# Patient Record
Sex: Female | Born: 2000 | Race: Black or African American | Hispanic: No | Marital: Single | Smoking: Former smoker
Health system: Southern US, Community
[De-identification: ages and names within clinical notes are randomized; demographics above are authoritative.]

## PROBLEM LIST (undated history)

## (undated) DIAGNOSIS — R011 Cardiac murmur, unspecified: Secondary | ICD-10-CM

---
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
## 2020-05-03 ENCOUNTER — Encounter (INDEPENDENT_AMBULATORY_CARE_PROVIDER_SITE_OTHER): Payer: Self-pay | Admitting: Primary Care

## 2020-05-03 ENCOUNTER — Ambulatory Visit (INDEPENDENT_AMBULATORY_CARE_PROVIDER_SITE_OTHER): Payer: Self-pay | Admitting: Primary Care

## 2020-05-03 ENCOUNTER — Other Ambulatory Visit: Payer: Self-pay

## 2020-05-03 VITALS — BP 120/72 | HR 62 | Temp 98.2°F | Ht 70.0 in | Wt 245.8 lb

## 2020-05-03 DIAGNOSIS — Z3009 Encounter for other general counseling and advice on contraception: Secondary | ICD-10-CM

## 2020-05-03 DIAGNOSIS — Z6835 Body mass index (BMI) 35.0-35.9, adult: Secondary | ICD-10-CM

## 2020-05-03 DIAGNOSIS — Z7689 Persons encountering health services in other specified circumstances: Secondary | ICD-10-CM

## 2020-05-03 DIAGNOSIS — E6609 Other obesity due to excess calories: Secondary | ICD-10-CM

## 2020-05-03 DIAGNOSIS — Z1159 Encounter for screening for other viral diseases: Secondary | ICD-10-CM

## 2020-05-03 DIAGNOSIS — E66812 Obesity, class 2: Secondary | ICD-10-CM

## 2020-05-03 NOTE — Progress Notes (Signed)
New Patient Office Visit  Subjective:  Patient ID: Mary Zavala, female    DOB: 19-Jul-2001  Age: 19 y.o. MRN: [id number]  CC:  Chief Complaint  Patient presents with  . New Patient (Initial Visit)    HPI Ms. Sarissa A Kaelin is a 19 year old female who presents for establishment of care. She is sexually active .  History reviewed. No pertinent past medical history.  History reviewed. No pertinent surgical history.  Family History  Problem Relation Age of Onset  . Hypertension Mother   . Asthma Mother   . Asthma Sister   . Asthma Brother   . Diabetes Maternal Grandmother   . Arthritis Maternal Grandfather   . Diabetes Paternal Grandmother   . Hypertension Paternal Grandmother   . Kidney disease Paternal Grandmother   . Arthritis Paternal Grandmother     Social History   Socioeconomic History  . Marital status: Single    Spouse name: Not on file  . Number of children: Not on file  . Years of education: Not on file  . Highest education level: Not on file  Occupational History  . Not on file  Tobacco Use  . Smoking status: Never Smoker  . Smokeless tobacco: Never Used  Substance and Sexual Activity  . Alcohol use: Not on file  . Drug use: Not on file  . Sexual activity: Not on file  Other Topics Concern  . Not on file  Social History Narrative  . Not on file   Social Determinants of Health   Financial Resource Strain:   . Difficulty of Paying Living Expenses:   Food Insecurity:   . Worried About Charity fundraiser in the Last Year:   . Arboriculturist in the Last Year:   Transportation Needs:   . Film/video editor (Medical):   Marland Kitchen Lack of Transportation (Non-Medical):   Physical Activity:   . Days of Exercise per Week:   . Minutes of Exercise per Session:   Stress:   . Feeling of Stress :   Social Connections:   . Frequency of Communication with Friends and Family:   . Frequency of Social Gatherings with Friends and Family:   . Attends  Religious Services:   . Active Member of Clubs or Organizations:   . Attends Archivist Meetings:   Marland Kitchen Marital Status:   Intimate Partner Violence:   . Fear of Current or Ex-Partner:   . Emotionally Abused:   Marland Kitchen Physically Abused:   . Sexually Abused:     ROS Review of Systems  All other systems reviewed and are negative.   Objective:   Today's Vitals: Vitals:   05/03/20 0909  BP: 120/72  Pulse: 62  Temp: 98.2 F (36.8 C)  TempSrc: Oral  SpO2: 99%  Weight: 245 lb 12.8 oz (111.5 kg)  Height: 5' 10"  (1.778 m)     Physical Exam  General: Vital signs reviewed.  Patient is well-developed and well-nourished,obese female  in no acute distress and cooperative with exam.  Head: Normocephalic and atraumatic. Eyes: EOMI, conjunctivae normal, no scleral icterus.  Neck: Supple, trachea midline, normal ROM, no JVD, masses, thyromegaly, or carotid bruit present.  Cardiovascular: RRR, S1 normal, S2 normal, no murmurs, gallops, or rubs. Pulmonary/Chest: Clear to auscultation bilaterally, no wheezes, rales, or rhonchi. Abdominal: Soft, non-tender, non-distended, BS +, no masses, organomegaly, or guarding present.  Musculoskeletal: No joint deformities, erythema, or stiffness, ROM full and nontender. Extremities: No lower extremity  edema bilaterally,  pulses symmetric and intact bilaterally. No cyanosis or clubbing. Neurological: A&O x3, Strength is normal and symmetric bilaterally, cranial nerve II-XII are grossly intact, no focal motor deficit, sensory intact to light touch bilaterally.  Skin: Warm, dry and intact. No rashes or erythema. Psychiatric: Normal mood and affect. speech and behavior is normal. Cognition and memory are normal.  Assessment & Plan:   Saraann was seen today for new patient (initial visit).  Diagnoses and all orders for this visit:  Encounter to establish care Juluis Mire, NP-C will be your  (PCP) she is mastered prepared . Able to diagnosed and  treatment also  answer health concern as well as continuing care of varied medical conditions, not limited by cause, organ system, or diagnosis.  -     CBC with Differential -     CMP14+EGFR  Encounter for other general counseling or advice on contraception Provided information on options . Return appointment will prescribe medication    Class 2 obesity due to excess calories without serious comorbidity with body mass index (BMI) of 35.0 to 35.9 in adult Obesity is 30-39 indicating an excess in caloric intake or underlining conditions. This may lead to other co-morbidities.HTN, DM respiratory problems.  Lifestyle modifications of diet and exercise may reduce obesity.  -     CBC with Differential -     CMP14+EGFR  Encounter for hepatitis C screening test for low risk patient Health care mantienace and care gaps -     Hepatitis C Antibody    Outpatient Encounter Medications as of 05/03/2020  Medication Sig  . [DISCONTINUED] cetirizine (ZYRTEC) 10 MG tablet Take 1 tablet (10 mg total) by mouth daily.  . [DISCONTINUED] ibuprofen (CHILDRENS IBUPROFEN) 100 MG/5ML suspension Take 30 mLs (600 mg total) by mouth every 6 (six) hours as needed.  . [DISCONTINUED] Multiple Vitamins-Minerals (MULTIVITAMIN GUMMIES ADULT PO) Take by mouth.  . [DISCONTINUED] Vitamin D, Ergocalciferol, (DRISDOL) 50000 units CAPS capsule Take 1 capsule (50,000 Units total) by mouth every 7 (seven) days.   No facility-administered encounter medications on file as of 05/03/2020.    Follow-up: Return in about 2 weeks (around 05/17/2020), or birth control.   Kerin Perna, NP

## 2020-05-03 NOTE — Patient Instructions (Signed)
.m Contraception Choices Contraception, also called birth control, means things to use or ways to try not to get pregnant. Hormonal birth control This kind of birth control uses hormones. Here are some types of hormonal birth control:  A tube that is put under skin of the arm (implant). The tube can stay in for as long as 3 years.  Shots to get every 3 months (injections).  Pills to take every day (birth control pills).  A patch to change 1 time each week for 3 weeks (birth control patch). After that, the patch is taken off for 1 week.  A ring to put in the vagina. The ring is left in for 3 weeks. Then it is taken out of the vagina for 1 week. Then a new ring is put in.  Pills to take after unprotected sex (emergency birth control pills). Barrier birth control Here are some types of barrier birth control:  A thin covering that is put on the penis before sex (female condom). The covering is thrown away after sex.  A soft, loose covering that is put in the vagina before sex (female condom). The covering is thrown away after sex.  A rubber bowl that sits over the cervix (diaphragm). The bowl must be made for you. The bowl is put into the vagina before sex. The bowl is left in for 6-8 hours after sex. It is taken out within 24 hours.  A small, soft cup that fits over the cervix (cervical cap). The cup must be made for you. The cup can be left in for 6-8 hours after sex. It is taken out within 48 hours.  A sponge that is put into the vagina before sex. It must be left in for at least 6 hours after sex. It must be taken out within 30 hours. Then it is thrown away.  A chemical that kills or stops sperm from getting into the uterus (spermicide). It may be a pill, cream, jelly, or foam to put in the vagina. The chemical should be used at least 10-15 minutes before sex. IUD (intrauterine) birth control An IUD is a small, T-shaped piece of plastic. It is put inside the uterus. There are two  kinds:  Hormone IUD. This kind can stay in for 3-5 years.  Copper IUD. This kind can stay in for 10 years. Permanent birth control Here are some types of permanent birth control:  Surgery to block the fallopian tubes.  Having an insert put into each fallopian tube.  Surgery to tie off the tubes that carry sperm (vasectomy). Natural planning birth control Here are some types of natural planning birth control:  Not having sex on the days the woman could get pregnant.  Using a calendar: ? To keep track of the length of each period. ? To find out what days pregnancy can happen. ? To plan to not have sex on days when pregnancy can happen.  Watching for symptoms of ovulation and not having sex during ovulation. One way the woman can check for ovulation is to check her temperature.  Waiting to have sex until after ovulation. Summary  Contraception, also called birth control, means things to use or ways to try not to get pregnant.  Hormonal methods of birth control include implants, injections, pills, patches, vaginal rings, and emergency birth control pills.  Barrier methods of birth control can include female condoms, female condoms, diaphragms, cervical caps, sponges, and spermicides.  There are two types of IUD (intrauterine device) birth  control. An IUD can be put in a woman's uterus to prevent pregnancy for 3-5 years.  Permanent sterilization can be done through a procedure for males, females, or both.  Natural planning methods involve not having sex on the days when the woman could get pregnant. This information is not intended to replace advice given to you by your health care provider. Make sure you discuss any questions you have with your health care provider. Document Revised: 01/04/2019 Document Reviewed: 09/24/2016 Elsevier Patient Education  2020 [employer].

## 2020-05-04 LAB — CBC WITH DIFFERENTIAL/PLATELET
Basophils Absolute: 0 10*3/uL (ref 0.0–0.2)
Basos: 0 %
EOS (ABSOLUTE): 0.1 10*3/uL (ref 0.0–0.4)
Eos: 1 %
Hematocrit: 38.9 % (ref 34.0–46.6)
Hemoglobin: 12.4 g/dL (ref 11.1–15.9)
Immature Grans (Abs): 0 10*3/uL (ref 0.0–0.1)
Immature Granulocytes: 0 %
Lymphocytes Absolute: 3.2 10*3/uL — ABNORMAL HIGH (ref 0.7–3.1)
Lymphs: 49 %
MCH: 26.6 pg (ref 26.6–33.0)
MCHC: 31.9 g/dL (ref 31.5–35.7)
MCV: 84 fL (ref 79–97)
Monocytes Absolute: 0.5 10*3/uL (ref 0.1–0.9)
Monocytes: 7 %
Neutrophils Absolute: 2.9 10*3/uL (ref 1.4–7.0)
Neutrophils: 43 %
Platelets: 311 10*3/uL (ref 150–450)
RBC: 4.66 x10E6/uL (ref 3.77–5.28)
RDW: 13.7 % (ref 11.7–15.4)
WBC: 6.6 10*3/uL (ref 3.4–10.8)

## 2020-05-04 LAB — CMP14+EGFR
ALT: 19 IU/L (ref 0–32)
AST: 14 IU/L (ref 0–40)
Albumin/Globulin Ratio: 1.4 (ref 1.2–2.2)
Albumin: 4.3 g/dL (ref 3.9–5.0)
Alkaline Phosphatase: 62 IU/L (ref 45–106)
BUN/Creatinine Ratio: 14 (ref 9–23)
BUN: 11 mg/dL (ref 6–20)
Bilirubin Total: 0.4 mg/dL (ref 0.0–1.2)
CO2: 24 mmol/L (ref 20–29)
Calcium: 9.5 mg/dL (ref 8.7–10.2)
Chloride: 103 mmol/L (ref 96–106)
Creatinine, Ser: 0.77 mg/dL (ref 0.57–1.00)
GFR calc Af Amer: 129 mL/min/{1.73_m2} (ref >59)
GFR calc non Af Amer: 112 mL/min/{1.73_m2} (ref >59)
Globulin, Total: 3.1 g/dL (ref 1.5–4.5)
Glucose: 83 mg/dL (ref 65–99)
Potassium: 4.8 mmol/L (ref 3.5–5.2)
Sodium: 140 mmol/L (ref 134–144)
Total Protein: 7.4 g/dL (ref 6.0–8.5)

## 2020-05-04 LAB — TSH+FREE T4
Free T4: 1.2 ng/dL (ref 0.82–1.77)
TSH: 0.996 u[IU]/mL (ref 0.450–4.500)

## 2020-05-04 LAB — HEPATITIS C ANTIBODY: Hep C Virus Ab: <0.1 s/co ratio (ref 0.0–0.9)

## 2020-05-22 ENCOUNTER — Ambulatory Visit (INDEPENDENT_AMBULATORY_CARE_PROVIDER_SITE_OTHER): Payer: Self-pay | Admitting: Primary Care

## 2021-07-22 ENCOUNTER — Ambulatory Visit (INDEPENDENT_AMBULATORY_CARE_PROVIDER_SITE_OTHER): Payer: Self-pay | Admitting: *Deleted

## 2021-07-22 NOTE — Telephone Encounter (Signed)
Pt reports had unprotected sex 2 nights ago. Also states had sex several weeks ago and "Cycle is about 2 weeks late." HAs not tested for pregnancy. States is interested in med to stop pregnancy. Advised would need test first to confirm pregnancy.  [facility] but could not get appt until Nov.  Asymptomatic except for "Tightness in the labia section." Pt established care with Marcelino Duster in 8/21. Archivist. Assured pt NT would route to practice for PCPs review and final disposition.  Please advise: [phone] Reason for Disposition  Nursing judgment  Protocols used: No Guideline or Reference Available-A-AH

## 2021-09-28 NOTE — L&D Delivery Note (Signed)
OB/GYN Faculty Practice Delivery Note  Mary Zavala is a 21 y.o. G1P0 s/p NSVD at unknown gestational age.   ROM: just prior to delivery with grossly purulent fluid, exact time not documented GBS Status: unknown   Maximum Maternal Temperature: 97.8 F    Labor Progress: Patient arrived to [name] ED via EMS reporting significant abdominal pain. She was subsequently found to be pregnant and in labor at 9 cm dilation on initial exam. RROB arrived to [facility] shortly after followed by myself. Fundal height was performed and was around 26-27 cm. Given concern for significantly preterm delivery no interventions were performed until NICU team was able to arrive a short time later. At that point patient was complete and feeling the urge to push, AROM was performed with copious green purulent fluid expelled. She then pushed over several contractions to have a NSVD.  Delivery Date/Time: 09/14/2022 at *** Delivery: Head delivered in OA position. Tight nuchal x1, delivered through. Shoulder and body delivered in usual fashion. Infant with spontaneous cry, placed on mother's abdomen, dried and stimulated. Cord clamped x 2 after 1-minute delay per NICU recommendation and then cut by myself. Cord blood drawn. Placenta delivered spontaneously with gentle cord traction. Fundus firm with massage and Pitocin. Labia, perineum, vagina, and cervix inspected with only minor abrasions.   Placenta: intact, to pathology Complications: none Lacerations: none EBL: minimal Analgesia: IV fentanyl   Infant: Baby boy  APGAR (1 MIN): *** APGAR (5 MINS): *** APGAR (10 MINS): ***  Weight: ***  [name], MD/MPH Attending Family Medicine Physician, [facility] for [facility]

## 2022-03-18 ENCOUNTER — Ambulatory Visit (INDEPENDENT_AMBULATORY_CARE_PROVIDER_SITE_OTHER): Payer: Self-pay | Admitting: *Deleted

## 2022-03-18 NOTE — Telephone Encounter (Signed)
  Chief Complaint: Headache Symptoms: "Migraine"headache, not dx with migraines. Onset 1 month, 2-3 times weekly, none presently. Sensitivity to light, lightheaded when occur.. Episode yesterday with nose bleed. Does not check BP, no meds. Also reports abdominal pain after eating, mid chest at sternum and pelvic "Discomfort." Some congestion today. Frequency: 1 month, off and on Pertinent Negatives: Patient denies  Disposition: [] ED /[] Urgent Care (no appt availability in office) / [x] Appointment(In office/virtual)/ []  [facility]/ [] Home Care/ [] Refused Recommended Disposition /[] [place] [facility]/ []  Follow-up with PCP Additional Notes: Care advise provided, verbalizes understanding. Secured next available appt in July. Advised ED for worsening symptoms. Advised to get BP checked today and CB if elevated. Placed on wait list. Reason for Disposition  Headache is a chronic symptom (recurrent or ongoing AND present > 4 weeks)  Answer Assessment - Initial Assessment Questions 1. LOCATION: "Where does it hurt?"      [place] 2. ONSET: "When did the headache start?" (Minutes, hours or days)      Months, off and on. Occurs every 3-4 days 3. PATTERN: "Does the pain come and go, or has it been constant since it started?"     Comes and goes, not presently 4. SEVERITY: "How bad is the pain?" and "What does it keep you from doing?"  (e.g., Scale 1-10; mild, moderate, or severe)   - MILD (1-3): doesn't interfere with normal activities    - MODERATE (4-7): interferes with normal activities or awakens from sleep    - SEVERE (8-10): excruciating pain, unable to do any normal activities        varies 5. RECURRENT SYMPTOM: "Have you ever had headaches before?" If Yes, ask: "When was the last time?" and "What happened that time?"      Yes, not this bad 6. CAUSE: "What do you think is causing the headache?"     Maybe migraines 7. MIGRAINE: "Have you been diagnosed with migraine headaches?" If  Yes, ask: "Is this headache similar?"      No 8. HEAD INJURY: "Has there been any recent injury to the head?"      No 9. OTHER SYMPTOMS: "Do you have any other symptoms?" (fever, stiff neck, eye pain, sore throat, cold symptoms)     Lightheaded when has headache, eyes sensitive to light. Some congestion now. Stomach pain after eating, mid chest under sternum. Had headache yesterday with nose bleed, 5 minute duration.  Protocols used: [facility]

## 2022-04-09 ENCOUNTER — Ambulatory Visit (INDEPENDENT_AMBULATORY_CARE_PROVIDER_SITE_OTHER): Payer: Medicaid Other | Admitting: Primary Care

## 2022-04-09 VITALS — BP 122/78 | HR 103 | Temp 97.9°F | Ht 70.0 in | Wt 235.4 lb

## 2022-04-09 DIAGNOSIS — Z3201 Encounter for pregnancy test, result positive: Secondary | ICD-10-CM

## 2022-04-09 DIAGNOSIS — Z3009 Encounter for other general counseling and advice on contraception: Secondary | ICD-10-CM

## 2022-04-09 DIAGNOSIS — N912 Amenorrhea, unspecified: Secondary | ICD-10-CM | POA: Diagnosis not present

## 2022-04-09 LAB — POCT URINE PREGNANCY: Preg Test, Ur: POSITIVE — AB

## 2022-04-09 NOTE — Patient Instructions (Signed)

## 2022-04-12 NOTE — Progress Notes (Unsigned)
  [facility]  [facility] Mian, is a 21 y.o. female  DSK:[id number]  [id number]  DOB - 2001-05-23  Chief Complaint  Patient presents with   Migraine       Subjective:   Mary Zavala is a 21 y.o. female here today for a follow up visit. Patient has No headache, No chest pain, No abdominal pain - No Nausea, No new weakness tingling or numbness, No Cough - shortness of breath  No problems updated.  No Known Allergies  No past medical history on file.  No current outpatient medications on file prior to visit.   No current facility-administered medications on file prior to visit.    Objective:   Vitals:   04/09/22 1534  BP: 122/78  Pulse: (!) 103  Temp: 97.9 F (36.6 C)  TempSrc: Oral  SpO2: 95%  Weight: 235 lb 6.4 oz (106.8 kg)  Height: 5\' 10"  (1.778 m)    Exam General appearance : Awake, alert, not in any distress. Speech Clear. Not toxic looking HEENT: Atraumatic and Normocephalic, pupils equally reactive to light and accomodation Neck: Supple, no JVD. No cervical lymphadenopathy.  Chest: Good air entry bilaterally, no added sounds  CVS: S1 S2 regular, no murmurs.  Abdomen: Bowel sounds present, Non tender and not distended with no gaurding, rigidity or rebound. Extremities: B/L Lower Ext shows no edema, both legs are warm to touch Neurology: Awake alert, and oriented X 3, CN II-XII intact, Non focal Skin: No Rash  Data Review Lab Results  Component Value Date   HGBA1C 5.4 01/05/2017   HGBA1C 6.0 (H) 11/18/2015   HGBA1C 5.9 (H) 05/24/2015    Assessment & Plan   1. Amenorrhea - POCT urine pregnancy- results positive- not what she wanted or expected. She was upset and crying . I am not ready nor want a baby he's just a fling wanting to get rid of the baby     Patient have been counseled extensively about nutrition and exercise. Other issues discussed during this visit include: low cholesterol diet, weight control and daily exercise,  foot care, annual eye examinations at Ophthalmology, importance of adherence with medications and regular follow-up. We also discussed long term complications of uncontrolled diabetes and hypertension.   No follow-ups on file.  The patient was given clear instructions to go to ER or return to medical center if symptoms don't improve, worsen or new problems develop. The patient verbalized understanding. The patient was told to call to get lab results if they haven't heard anything in the next week.   This note has been created with 05/26/2015. Any transcriptional errors are unintentional.   Education officer, environmental, NP 04/12/2022, 10:35 PM

## 2022-04-21 ENCOUNTER — Telehealth (INDEPENDENT_AMBULATORY_CARE_PROVIDER_SITE_OTHER): Payer: Self-pay | Admitting: Primary Care

## 2022-04-21 NOTE — Telephone Encounter (Signed)
Pt is calling to request a letter for work stating that is ok for her to return back to work. Please advise CB- [phone]

## 2022-04-21 NOTE — Telephone Encounter (Signed)
Attempted to reach patient. No voicemail and number provided is not valid.

## 2022-04-22 NOTE — Telephone Encounter (Signed)
Unable to reach patient at number provided

## 2022-05-13 ENCOUNTER — Encounter (INDEPENDENT_AMBULATORY_CARE_PROVIDER_SITE_OTHER): Payer: Self-pay | Admitting: Primary Care

## 2022-05-13 ENCOUNTER — Ambulatory Visit (INDEPENDENT_AMBULATORY_CARE_PROVIDER_SITE_OTHER): Payer: Self-pay | Admitting: Primary Care

## 2022-05-13 VITALS — BP 116/77 | HR 111 | Temp 98.2°F | Ht 70.0 in | Wt 223.4 lb

## 2022-05-13 DIAGNOSIS — Z30019 Encounter for initial prescription of contraceptives, unspecified: Secondary | ICD-10-CM

## 2022-05-13 NOTE — Progress Notes (Signed)
  [facility]  Mary Zavala, is a 21 y.o. female  MLY:[id number]  [id number]  DOB - [birth year]  Chief Complaint  Patient presents with   Contraception    Referral to OB-GYN for possible IUD insertion  Patient was pregnant but terminated the pregnancy       Subjective:   Mary Zavala is a 21 y.o. female here today for a follow up visit to discuss birth control.  In form patient due to her BMI not all birth controls are going to be as effective.  We reviewed several different types and there was significant difference in protection for obesity.  She is thinking towards an IUD but information for contraception given on AVS.Patient has No headache, No chest pain, No abdominal pain - No Nausea, No new weakness tingling or numbness, No Cough - shortness of breath  No problems updated.  No Known Allergies  History reviewed. No pertinent past medical history.  No current outpatient medications on file prior to visit.   No current facility-administered medications on file prior to visit.    Objective:   Vitals:   05/13/22 1555  BP: 116/77  Pulse: (!) 111  Temp: 98.2 F (36.8 C)  TempSrc: Oral  SpO2: 98%  Weight: 223 lb 6.4 oz (101.3 kg)  Height: 5\' 10"  (1.778 m)    Exam General appearance : Awake, alert, not in any distress. Speech Clear. Not toxic looking HEENT: Atraumatic and Normocephalic, pupils equally reactive to light and accomodation Neck: Supple, no JVD. No cervical lymphadenopathy.  Chest: Good air entry bilaterally, no added sounds  CVS: S1 S2 regular, no murmurs.  Abdomen: Bowel sounds present, Non tender and not distended with no gaurding, rigidity or rebound. Extremities: B/L Lower Ext shows no edema, both legs are warm to touch Neurology: Awake alert, and oriented X 3, Non focal Skin: No Rash  Data Review Lab Results  Component Value Date   HGBA1C 5.4 01/05/2017   HGBA1C 6.0 (H) 11/18/2015   HGBA1C 5.9 (H) 05/24/2015     Assessment & Plan   1. Encounter for initial prescription of contraceptives, unspecified contraceptive - Ambulatory referral to Gynecology    Patient have been counseled extensively about nutrition and exercise. Other issues discussed during this visit include: low cholesterol diet, weight control and daily exercise, foot care, annual eye examinations at Ophthalmology, importance of adherence with medications and regular follow-up. We also discussed long term complications of uncontrolled diabetes and hypertension.   Return in about 3 months (around 08/13/2022) for anxiety.  The patient was given clear instructions to go to ER or return to medical center if symptoms don't improve, worsen or new problems develop. The patient verbalized understanding. The patient was told to call to get lab results if they haven't heard anything in the next week.   This note has been created with 08/15/2022. Any transcriptional errors are unintentional.   Education officer, environmental, NP 05/17/2022, 8:53 PM

## 2022-05-13 NOTE — Patient Instructions (Signed)

## 2022-08-13 ENCOUNTER — Ambulatory Visit (INDEPENDENT_AMBULATORY_CARE_PROVIDER_SITE_OTHER): Payer: Medicaid Other | Admitting: Primary Care

## 2022-09-14 ENCOUNTER — Other Ambulatory Visit: Payer: Self-pay

## 2022-09-14 ENCOUNTER — Inpatient Hospital Stay (HOSPITAL_COMMUNITY)
Admission: EM | Admit: 2022-09-14 | Discharge: 2022-09-16 | DRG: 806 | Disposition: A | Discharge status: Home / Self Care | Payer: Medicaid Other | Attending: Obstetrics & Gynecology | Admitting: Obstetrics & Gynecology

## 2022-09-14 ENCOUNTER — Encounter (HOSPITAL_COMMUNITY): Payer: Self-pay | Admitting: Family Medicine

## 2022-09-14 ENCOUNTER — Emergency Department (HOSPITAL_COMMUNITY): Payer: Medicaid Other

## 2022-09-14 DIAGNOSIS — O9081 Anemia of the puerperium: Secondary | ICD-10-CM | POA: Diagnosis not present

## 2022-09-14 DIAGNOSIS — O1414 Severe pre-eclampsia complicating childbirth: Principal | ICD-10-CM | POA: Diagnosis present

## 2022-09-14 DIAGNOSIS — O0930 Supervision of pregnancy with insufficient antenatal care, unspecified trimester: Secondary | ICD-10-CM

## 2022-09-14 DIAGNOSIS — O411290 Chorioamnionitis, unspecified trimester, not applicable or unspecified: Secondary | ICD-10-CM | POA: Diagnosis present

## 2022-09-14 DIAGNOSIS — O26893 Other specified pregnancy related conditions, third trimester: Secondary | ICD-10-CM | POA: Diagnosis present

## 2022-09-14 DIAGNOSIS — O411230 Chorioamnionitis, third trimester, not applicable or unspecified: Secondary | ICD-10-CM

## 2022-09-14 DIAGNOSIS — Z79899 Other long term (current) drug therapy: Secondary | ICD-10-CM

## 2022-09-14 DIAGNOSIS — Z20822 Contact with and (suspected) exposure to covid-19: Secondary | ICD-10-CM | POA: Diagnosis present

## 2022-09-14 DIAGNOSIS — O169 Unspecified maternal hypertension, unspecified trimester: Secondary | ICD-10-CM | POA: Diagnosis present

## 2022-09-14 DIAGNOSIS — D62 Acute posthemorrhagic anemia: Secondary | ICD-10-CM | POA: Diagnosis not present

## 2022-09-14 DIAGNOSIS — O99214 Obesity complicating childbirth: Secondary | ICD-10-CM | POA: Diagnosis present

## 2022-09-14 DIAGNOSIS — Z3A00 Weeks of gestation of pregnancy not specified: Secondary | ICD-10-CM

## 2022-09-14 DIAGNOSIS — O1415 Severe pre-eclampsia, complicating the puerperium: Secondary | ICD-10-CM | POA: Diagnosis present

## 2022-09-14 DIAGNOSIS — A419 Sepsis, unspecified organism: Secondary | ICD-10-CM

## 2022-09-14 DIAGNOSIS — O1490 Unspecified pre-eclampsia, unspecified trimester: Secondary | ICD-10-CM

## 2022-09-14 HISTORY — DX: Cardiac murmur, unspecified: R01.1

## 2022-09-14 LAB — HCG, QUANTITATIVE, PREGNANCY: hCG, Beta Chain, Quant, S: 65811 m[IU]/mL — ABNORMAL HIGH (ref <5)

## 2022-09-14 LAB — RESP PANEL BY RT-PCR (RSV, FLU A&B, COVID)  RVPGX2
Influenza A by PCR: NEGATIVE
Influenza B by PCR: NEGATIVE
Resp Syncytial Virus by PCR: NEGATIVE
SARS Coronavirus 2 by RT PCR: NEGATIVE

## 2022-09-14 LAB — COMPREHENSIVE METABOLIC PANEL
ALT: 11 U/L (ref 0–44)
AST: 15 U/L (ref 15–41)
Albumin: 3.4 g/dL — ABNORMAL LOW (ref 3.5–5.0)
Alkaline Phosphatase: 106 U/L (ref 38–126)
Anion gap: 10 (ref 5–15)
BUN: 5 mg/dL — ABNORMAL LOW (ref 6–20)
CO2: 17 mmol/L — ABNORMAL LOW (ref 22–32)
Calcium: 9.3 mg/dL (ref 8.9–10.3)
Chloride: 108 mmol/L (ref 98–111)
Creatinine, Ser: 0.48 mg/dL (ref 0.44–1.00)
GFR, Estimated: >60 mL/min (ref >60)
Glucose, Bld: 142 mg/dL — ABNORMAL HIGH (ref 70–99)
Potassium: 3.5 mmol/L (ref 3.5–5.1)
Sodium: 135 mmol/L (ref 135–145)
Total Bilirubin: 1 mg/dL (ref 0.3–1.2)
Total Protein: 7.9 g/dL (ref 6.5–8.1)

## 2022-09-14 LAB — RAPID HIV SCREEN (HIV 1/2 AB+AG)
HIV 1/2 Antibodies: NONREACTIVE
HIV-1 P24 Antigen - HIV24: NONREACTIVE

## 2022-09-14 LAB — CBC
HCT: 34.6 % — ABNORMAL LOW (ref 36.0–46.0)
Hemoglobin: 11.7 g/dL — ABNORMAL LOW (ref 12.0–15.0)
MCH: 29.3 pg (ref 26.0–34.0)
MCHC: 33.8 g/dL (ref 30.0–36.0)
MCV: 86.5 fL (ref 80.0–100.0)
Platelets: 220 10*3/uL (ref 150–400)
RBC: 4 MIL/uL (ref 3.87–5.11)
RDW: 12.7 % (ref 11.5–15.5)
WBC: 19.8 10*3/uL — ABNORMAL HIGH (ref 4.0–10.5)
nRBC: 0 % (ref 0.0–0.2)

## 2022-09-14 LAB — I-STAT BETA HCG BLOOD, ED (MC, WL, AP ONLY): I-stat hCG, quantitative: >2000 m[IU]/mL — ABNORMAL HIGH (ref <5)

## 2022-09-14 LAB — TYPE AND SCREEN
ABO/RH(D): O POS
Antibody Screen: NEGATIVE

## 2022-09-14 LAB — LIPASE, BLOOD: Lipase: 68 U/L — ABNORMAL HIGH (ref 11–51)

## 2022-09-14 LAB — LACTIC ACID, PLASMA: Lactic Acid, Venous: 1.5 mmol/L (ref 0.5–1.9)

## 2022-09-14 LAB — HEPATITIS B SURFACE ANTIGEN: Hepatitis B Surface Ag: NONREACTIVE

## 2022-09-14 MED ORDER — NIFEDIPINE ER OSMOTIC RELEASE 30 MG PO TB24
30.0000 mg | ORAL_TABLET | Freq: Every day | ORAL | Status: DC
  Administered 2022-09-14 – 2022-09-16 (×3): 30 mg via ORAL
  Filled 2022-09-14 (×4): qty 1

## 2022-09-14 MED ORDER — HYDRALAZINE HCL 20 MG/ML IJ SOLN: 10.0000 mg | INTRAMUSCULAR | Status: DC | PRN

## 2022-09-14 MED ORDER — LACTATED RINGERS IV SOLN: 125.0000 mL/h | INTRAVENOUS | Status: AC

## 2022-09-14 MED ORDER — CALCIUM CARBONATE ANTACID 500 MG PO CHEW: 2.0000 | CHEWABLE_TABLET | ORAL | Status: DC | PRN

## 2022-09-14 MED ORDER — DIPHENHYDRAMINE HCL 25 MG PO CAPS: 25.0000 mg | ORAL_CAPSULE | Freq: Four times a day (QID) | ORAL | Status: DC | PRN

## 2022-09-14 MED ORDER — GLUCOSE 40 % PO GEL
ORAL | Status: AC
  Filled 2022-09-14: qty 1

## 2022-09-14 MED ORDER — COCONUT OIL OIL: 1.0000 | TOPICAL_OIL | Status: DC | PRN

## 2022-09-14 MED ORDER — FENTANYL CITRATE PF 50 MCG/ML IJ SOSY
50.0000 ug | PREFILLED_SYRINGE | Freq: Once | INTRAMUSCULAR | Status: AC
  Administered 2022-09-14: 50 ug via INTRAVENOUS
  Filled 2022-09-14: qty 1

## 2022-09-14 MED ORDER — ONDANSETRON HCL 4 MG/2ML IJ SOLN: 4.0000 mg | INTRAMUSCULAR | Status: DC | PRN

## 2022-09-14 MED ORDER — LABETALOL HCL 5 MG/ML IV SOLN: 80.0000 mg | INTRAVENOUS | Status: DC | PRN

## 2022-09-14 MED ORDER — AZITHROMYCIN 250 MG PO TABS
1000.0000 mg | ORAL_TABLET | Freq: Once | ORAL | Status: DC
  Filled 2022-09-14: qty 4

## 2022-09-14 MED ORDER — ZOLPIDEM TARTRATE 5 MG PO TABS: 5.0000 mg | ORAL_TABLET | Freq: Every evening | ORAL | Status: DC | PRN

## 2022-09-14 MED ORDER — SODIUM CHLORIDE 0.9 % IV SOLN
2.0000 g | Freq: Four times a day (QID) | INTRAVENOUS | Status: DC
  Filled 2022-09-14 (×2): qty 2000

## 2022-09-14 MED ORDER — ENOXAPARIN SODIUM 60 MG/0.6ML IJ SOSY
50.0000 mg | PREFILLED_SYRINGE | INTRAMUSCULAR | Status: DC
  Filled 2022-09-14: qty 0.6

## 2022-09-14 MED ORDER — AMOXICILLIN 500 MG PO CAPS: 500.0000 mg | ORAL_CAPSULE | Freq: Three times a day (TID) | ORAL | Status: DC

## 2022-09-14 MED ORDER — LABETALOL HCL 5 MG/ML IV SOLN: 40.0000 mg | INTRAVENOUS | Status: DC | PRN

## 2022-09-14 MED ORDER — SIMETHICONE 80 MG PO CHEW: 80.0000 mg | CHEWABLE_TABLET | ORAL | Status: DC | PRN

## 2022-09-14 MED ORDER — LABETALOL HCL 5 MG/ML IV SOLN: 20.0000 mg | INTRAVENOUS | Status: DC | PRN

## 2022-09-14 MED ORDER — SENNOSIDES-DOCUSATE SODIUM 8.6-50 MG PO TABS
2.0000 | ORAL_TABLET | Freq: Every day | ORAL | Status: DC
  Administered 2022-09-16: 2 via ORAL
  Filled 2022-09-14: qty 2

## 2022-09-14 MED ORDER — ACETAMINOPHEN 325 MG PO TABS
650.0000 mg | ORAL_TABLET | ORAL | Status: DC | PRN
  Administered 2022-09-15 – 2022-09-16 (×3): 650 mg via ORAL
  Filled 2022-09-14: qty 2

## 2022-09-14 MED ORDER — MAGNESIUM SULFATE BOLUS VIA INFUSION
4.0000 g | Freq: Once | INTRAVENOUS | Status: AC
  Administered 2022-09-14: 4 g via INTRAVENOUS
  Filled 2022-09-14: qty 1000

## 2022-09-14 MED ORDER — FENTANYL CITRATE PF 50 MCG/ML IJ SOSY
PREFILLED_SYRINGE | INTRAMUSCULAR | Status: AC
  Administered 2022-09-14: 50 ug via INTRAVENOUS
  Filled 2022-09-14: qty 1

## 2022-09-14 MED ORDER — PRENATAL MULTIVITAMIN CH: 1.0000 | ORAL_TABLET | Freq: Every day | ORAL | Status: DC

## 2022-09-14 MED ORDER — FENTANYL CITRATE PF 50 MCG/ML IJ SOSY: 50.0000 ug | PREFILLED_SYRINGE | Freq: Once | INTRAMUSCULAR | Status: AC

## 2022-09-14 MED ORDER — BENZOCAINE-MENTHOL 20-0.5 % EX AERO: 1.0000 | INHALATION_SPRAY | CUTANEOUS | Status: DC | PRN

## 2022-09-14 MED ORDER — WITCH HAZEL-GLYCERIN EX PADS: 1.0000 | MEDICATED_PAD | CUTANEOUS | Status: DC | PRN

## 2022-09-14 MED ORDER — LACTATED RINGERS IV SOLN: INTRAVENOUS | Status: DC

## 2022-09-14 MED ORDER — PRENATAL MULTIVITAMIN CH
1.0000 | ORAL_TABLET | Freq: Every day | ORAL | Status: DC
  Administered 2022-09-15: 1 via ORAL
  Filled 2022-09-14: qty 1

## 2022-09-14 MED ORDER — LACTATED RINGERS IV BOLUS
1000.0000 mL | Freq: Once | INTRAVENOUS | Status: AC
  Administered 2022-09-14: 1000 mL via INTRAVENOUS

## 2022-09-14 MED ORDER — TETANUS-DIPHTH-ACELL PERTUSSIS 5-2.5-18.5 LF-MCG/0.5 IM SUSY: 0.5000 mL | PREFILLED_SYRINGE | Freq: Once | INTRAMUSCULAR | Status: DC

## 2022-09-14 MED ORDER — DIBUCAINE (PERIANAL) 1 % EX OINT: 1.0000 | TOPICAL_OINTMENT | CUTANEOUS | Status: DC | PRN

## 2022-09-14 MED ORDER — IBUPROFEN 600 MG PO TABS
600.0000 mg | ORAL_TABLET | Freq: Four times a day (QID) | ORAL | Status: DC
  Administered 2022-09-14 – 2022-09-16 (×6): 600 mg via ORAL
  Filled 2022-09-14 (×7): qty 1

## 2022-09-14 MED ORDER — DOCUSATE SODIUM 100 MG PO CAPS
100.0000 mg | ORAL_CAPSULE | Freq: Every day | ORAL | Status: DC
  Administered 2022-09-14: 100 mg via ORAL
  Filled 2022-09-14 (×2): qty 1

## 2022-09-14 MED ORDER — PIPERACILLIN-TAZOBACTAM 3.375 G IVPB
3.3750 g | Freq: Three times a day (TID) | INTRAVENOUS | Status: DC
  Administered 2022-09-14 – 2022-09-16 (×5): 3.375 g via INTRAVENOUS
  Filled 2022-09-14 (×5): qty 50

## 2022-09-14 MED ORDER — MAGNESIUM SULFATE 40 GM/1000ML IV SOLN
2.0000 g/h | INTRAVENOUS | Status: AC
  Administered 2022-09-15: 2 g/h via INTRAVENOUS
  Filled 2022-09-14 (×2): qty 1000

## 2022-09-14 MED ORDER — ENOXAPARIN SODIUM 30 MG/0.3ML IJ SOSY: 30.0000 mg | PREFILLED_SYRINGE | INTRAMUSCULAR | Status: DC

## 2022-09-14 MED ORDER — ONDANSETRON HCL 4 MG/2ML IJ SOLN
4.0000 mg | Freq: Once | INTRAMUSCULAR | Status: AC
  Administered 2022-09-14: 4 mg via INTRAVENOUS
  Filled 2022-09-14: qty 2

## 2022-09-14 MED ORDER — ONDANSETRON HCL 4 MG PO TABS: 4.0000 mg | ORAL_TABLET | ORAL | Status: DC | PRN

## 2022-09-14 MED ORDER — PIPERACILLIN-TAZOBACTAM 3.375 G IVPB
3.3750 g | Freq: Once | INTRAVENOUS | Status: AC
  Administered 2022-09-14: 3.375 g via INTRAVENOUS
  Filled 2022-09-14: qty 50

## 2022-09-14 MED ORDER — ACETAMINOPHEN 325 MG PO TABS
650.0000 mg | ORAL_TABLET | ORAL | Status: DC | PRN
  Filled 2022-09-14 (×2): qty 2

## 2022-09-14 NOTE — H&P (Signed)
ANTENATAL ADMISSION HISTORY AND PHYSICAL NOTE  Mary Zavala is a 21 y.o. female G1P0 with IUP at Unknown presenting for delivery.   Patient now s/p vaginal delivery in [facility]. Our team was notified patient had presented there, RROB nurse went to evaluate and patient was found to be 9 cm and feeling pressure. I arrived shortly after and patient was complete. After coordinating presence of NICU team she had AROM with grossly purulent fluid and then had an uncomplicated NSVD. During this time she had multiple severe range BP's. Due to concern for chorioamnionitis and severe PreE, she was started on 4g mg bolus with 2 mg infusion to follow as well as Zosyn per pharmacy protocol.   She was unsure of her gestational age. She reported having intermittent headaches but no other PreE symptoms recently. She denies any medical problems, no hx of HTN.  Did not discuss feeding plan or contraception with patient.   Prenatal History/Complications: No PNC  Pregnancy complications:  Patient Active Problem List   Diagnosis Date Noted   Pre-eclampsia, severe, postpartum condition 09/14/2022   Left ankle pain 06/22/2016   Borderline hypertension 11/18/2015   Morbid obesity (HCC) 05/24/2015   Hypovitaminosis D 05/24/2015   Acanthosis nigricans 05/24/2015   Pre-diabetes 05/24/2015   BMI (body mass index), pediatric, greater than or equal to 95% for age 59/30/2015    Past Medical History: No past medical history on file.  Past Surgical History: No past surgical history on file.  Obstetrical History: OB History     Gravida  1   Para      Term      Preterm      AB      Living         SAB      IAB      Ectopic      Multiple      Live Births              Social History: Social History   Socioeconomic History   Marital status: Single    Spouse name: Not on file   Number of children: Not on file   Years of education: Not on file   Highest education level: Not on file   Occupational History   Not on file  Tobacco Use   Smoking status: Never   Smokeless tobacco: Never  Substance and Sexual Activity   Alcohol use: Not on file   Drug use: Not on file   Sexual activity: Not on file  Other Topics Concern   Not on file  Social History Narrative   Not on file   Social Determinants of Health   Financial Resource Strain: Not on file  Food Insecurity: Not on file  Transportation Needs: Not on file  Physical Activity: Not on file  Stress: Not on file  Social Connections: Not on file    Family History: Family History  Problem Relation Age of Onset   Hypertension Mother    Asthma Mother    Asthma Sister    Asthma Brother    Diabetes Maternal Grandmother    Arthritis Maternal Grandfather    Diabetes Paternal Grandmother    Hypertension Paternal Grandmother    Kidney disease Paternal Grandmother    Arthritis Paternal Grandmother     Allergies: No Known Allergies  (Not in a hospital admission)    Review of Systems  All systems reviewed and negative except as stated in HPI  Physical Exam BP (!) 134/112  Pulse 100   Temp 97.8 F (36.6 C) (Oral)   Resp 18   LMP 09/07/2022   SpO2 100%   Physical Exam Constitutional:      General: She is not in acute distress.    Appearance: Normal appearance. She is not ill-appearing.  HENT:     Head: Atraumatic.  Eyes:     General: No scleral icterus.    Conjunctiva/sclera: Conjunctivae normal.  Pulmonary:     Effort: Pulmonary effort is normal.  Skin:    General: Skin is warm and dry.     Coloration: Skin is not jaundiced or pale.  Neurological:     Mental Status: She is alert.     Coordination: Coordination normal.  Psychiatric:        Mood and Affect: Mood normal.        Behavior: Behavior normal.        Prenatal labs: ABO, Rh:   Antibody:   Rubella:   RPR:    HBsAg:    HIV:    GC/Chlamydia: No results found for: "N", "CHLAMYDIAWP" GBS:      Prenatal Transfer Tool   Maternal Diabetes: unknown Genetic Screening: not done Maternal Ultrasounds/Referrals: not done Fetal Ultrasounds or other Referrals:  none Maternal Substance Abuse:  unknown Significant Maternal Medications:  None Significant Maternal Lab Results: GBS unknown  Results for orders placed or performed during the hospital encounter of 09/14/22 (from the past 24 hour(s))  Lipase, blood   Collection Time: 09/14/22  6:21 AM  Result Value Ref Range   Lipase 68 (H) 11 - 51 U/L  Comprehensive metabolic panel   Collection Time: 09/14/22  6:21 AM  Result Value Ref Range   Sodium 135 135 - 145 mmol/L   Potassium 3.5 3.5 - 5.1 mmol/L   Chloride 108 98 - 111 mmol/L   CO2 17 (L) 22 - 32 mmol/L   Glucose, Bld 142 (H) 70 - 99 mg/dL   BUN 5 (L) 6 - 20 mg/dL   Creatinine, Ser 6.38 0.44 - 1.00 mg/dL   Calcium 9.3 8.9 - 46.6 mg/dL   Total Protein 7.9 6.5 - 8.1 g/dL   Albumin 3.4 (L) 3.5 - 5.0 g/dL   AST 15 15 - 41 U/L   ALT 11 0 - 44 U/L   Alkaline Phosphatase 106 38 - 126 U/L   Total Bilirubin 1.0 0.3 - 1.2 mg/dL   GFR, Estimated >59 >93 mL/min   Anion gap 10 5 - 15  CBC   Collection Time: 09/14/22  6:21 AM  Result Value Ref Range   WBC 19.8 (H) 4.0 - 10.5 K/uL   RBC 4.00 3.87 - 5.11 MIL/uL   Hemoglobin 11.7 (L) 12.0 - 15.0 g/dL   HCT 57.0 (L) 17.7 - 93.9 %   MCV 86.5 80.0 - 100.0 fL   MCH 29.3 26.0 - 34.0 pg   MCHC 33.8 30.0 - 36.0 g/dL   RDW 03.0 09.2 - 33.0 %   Platelets 220 150 - 400 K/uL   nRBC 0.0 0.0 - 0.2 %  I-Stat Beta hCG blood, ED ([facility], [facility], [facility] only)   Collection Time: 09/14/22  1:15 PM  Result Value Ref Range   I-stat hCG, quantitative >2,000.0 (H) <5 mIU/mL   Comment 3          Lactic acid, plasma   Collection Time: 09/14/22  1:24 PM  Result Value Ref Range   Lactic Acid, Venous 1.5 0.5 - 1.9 mmol/L  hCG, quantitative, pregnancy  Collection Time: 09/14/22  1:27 PM  Result Value Ref Range   hCG, Beta Chain, Quant, S 65,811 (H) <5 mIU/mL  Resp panel by RT-PCR (RSV,  Flu A&B, Covid) Anterior Nasal Swab   Collection Time: 09/14/22  1:30 PM   Specimen: Anterior Nasal Swab  Result Value Ref Range   SARS Coronavirus 2 by RT PCR NEGATIVE NEGATIVE   Influenza A by PCR NEGATIVE NEGATIVE   Influenza B by PCR NEGATIVE NEGATIVE   Resp Syncytial Virus by PCR NEGATIVE NEGATIVE    Assessment: Mary Zavala is a 21 y.o. G1P0 at Unknown s/p NSVD in [facility] who is also found to have chorioamnionitis and severe pre-eclampsia.   #Labor: s/p uncomplicated NSVD. Some abrasions but no lacerations.  #Pain: Routine postpartum orders #FHT: N/a #GBS/ID: Unknown #MOF:  TBD #MOC:  TBD #Circ: Undecided  #Severe Pre-eclampsia: started on Mg, labetalol protocol. CBC and CMP unremarkable. Continue Mg for 24h pp. Also started on 30 Nifedipine XL.   #Chorioamnionitis: zosyn per pharmacy protocol x24h  #Scant PNC: OB panel pending  [name], MD/MPH Attending Family Medicine Physician, [facility] for [facility]  09/14/2022, 3:33 PM

## 2022-09-14 NOTE — Progress Notes (Signed)
Called to [facility] for laboring patient in ED.  Patient has not received [facility] and did not know she was pregnant.  Unknown EDC at time of arrival.  BBOW and C/C/+1 on SVE.  Awaiting NICU team to arrive before attempting to SVD.

## 2022-09-14 NOTE — ED Notes (Signed)
See rapid OB nurse notes for delivery documentation.

## 2022-09-14 NOTE — Consult Note (Signed)
NEONATAL DELIVERY CONSULTATION  Delivery Note         03/02/22  3:56 PM  DATE BIRTH/Time:  2024-07-26   NAME:    Mary Zavala   MRN:    [id number] ACCOUNT NUMBER:    [id number]  BIRTH DATE/Time:  2024-03-06    ATTEND REQ BY:  L&D REASON FOR ATTEND: Emergent vaginal delivery of preterm infant at [facility] long   MATERNAL HISTORY  Age:    21 Race:    Black  Blood Type:      This patient's mother is not on file. Gravida/Para/Ab:  This patient's mother is not on file. RPR:     This patient's mother is not on file. HIV:     This patient's mother is not on file. Rubella:    This patient's mother is not on file.   GBS:     This patient's mother is not on file. HBsAg:    This patient's mother is not on file.  EDC-OB:   This patient's mother is not on file. Prenatal Care (Y/N/?): N Maternal MR#:  This patient's mother is not on file. Name:    This patient's mother is not on file.  Family History:  This patient's mother is not on file.      Pregnancy complications:  No prenatal care (seen July 13th and noted to have positive pregnancy test with LMP reported in May). Then seen August 16th after reported attempted termination and no pregnancy test performed.     Meds (prenatal/labor/del): none    DELIVERY  Date of Birth:    09/09/2024 Time of Birth:     61  Live Births:   singleton Birth Order:   n/a  Delivery Clinician:   Birth Hospital:  [facility] or [facility]  ROM prior to deliv (Y/N/?): Y ROM Type:    spontaneous ROM Date:    unknown ROM Time:     Fluid at Delivery:   Purulent amniotic fluid  Presentation:     Cephalic  Anesthesia:    none  Route of delivery:   vaginal    Apgar scores:  7  at 1 minute (2 off for color and 1 off for tone)     8  at 5 minutes (2 off for color)        Delayed Cord Clamping: 30 seconds   LABOR/DELIVERY Comments: Requested by [facility] long ED team to attend this vaginal delivery. NICU  transport team (RT, NNP, and Neonatologist) deployed and arrived just prior to delivery. No prenatal care and reported estimated gestational age by OB was 26 weeks. Patient delivered through purulent fluid, appeared >2 kg and > 30 weeks and was vigorous at mother's abdomen so infant allowed 30 seconds of delayed cord clamping. Then brought infant to warmer bed where they were dried, bulb suctioned, stimulated, and placed on thermal mattress. Good spontaneous respiratory effort and HR>100bpm. However, infant with significant retractions and persistent cyanosis at 5 min of life so started on CPAP of +5 and FiO2 incrementally increased to 100%. Due to persistent severe retractions, CPAP increased to +7 and work of breathing remained significant. Deep suctioned copious amount of purulent secretions from posterior oropharynx with limited improvement so decision made at 10 min of life to intubate. Successfully intubated on 3rd attempt at 14 min of life by neonatologist. 3.0 ET tube secured at 8cm and provided breaths at 20/7 and 100%. Surfactant administered at 18 min of life (completed at 1500). Intercostal  and subcostal retractions gradually improved so PEEP decreased to +6 and PiP decreased to 18. FiO2 weaned to 30% and [facility]   PHYSICAL EXAM  General:  Alert and active, mild respiratory distress. Head:  no trauma findings, normocephalic, anterior fontanelle soft and flat Oropharynx:   moist mucous membranes no exudates or petechiae. Palate intact. Lungs:   Clear to auscultation bilaterally with few crackles appreciated. Lungs with bilateral good air entry, moderate work of breathing (nasal flair and subcostal retractions). No wheezing appreciated.  Heart:   Regular rate and rhythm, normal S1, S2, no murmurs or gallops. Cap refill <2s and 2+ peripheral pulses Abdomen:   Abdomen soft, no masses, organomegaly, 3 vessel umbilical cord Neuro:   Moves all 4 extremities well. Normal tone. Chest/Spine:  No visible defects appreciated MSK/Skin: No lesions, bruising, or rash appreciated GU: Normal external genitalia   Neonatologist at delivery: Joycelyn Man NNP at delivery:  Crista Curb Others at delivery:  RT Monica Martinez), RN   ASSESSMENT/PLAN:  Mary Zavala with respiratory distress in need of critical support/monitoring. Will admit to NICU for further assessment and stabilization (refer to admission H&P).   **PLEASE send maternal prenatal labs (Hep B, HIV, RPR, ABO) to facilitate neonatal care**  ______________________ Electronically Signed By:  Harlow Mares, MD Attending Neonatologist

## 2022-09-14 NOTE — ED Provider Notes (Signed)
[facility]-EMERGENCY DEPT Provider Note   CSN: [id number] Arrival date & time: 09/14/22  0554     History  Chief Complaint  Patient presents with   Abdominal Pain    Mary Zavala is a 21 y.o. female.   Abdominal Pain    21 year old female presenting to the emergency department with roughly 2 days of abdominal pain.  She endorses a cramping and bloated sensation.  She endorses a last menstrual period of last week stating she had vaginal bleeding.  She states that her menstrual cycles are irregular.  Per EMS report, she has had worsening abdominal pain over the last 2 days.  She states that is intermittently cramping bilaterally in her pelvis.  She endorses some vaginal discharge.  Denies any active vaginal bleeding or leakage of fluid.  She had an episode of NB emesis yesterday and endorses persistent nausea.  Home Medications Prior to Admission medications   Not on File      Allergies    Patient has no known allergies.    Review of Systems   Review of Systems  Unable to perform ROS: Acuity of condition  Gastrointestinal:  Positive for abdominal pain.    Physical Exam Updated Vital Signs BP 135/80   Pulse (!) 105   Temp 97.8 F (36.6 C) (Oral)   Resp 18   LMP 09/07/2022   SpO2 100%  Physical Exam Vitals and nursing note reviewed.  Constitutional:      General: She is not in acute distress.    Appearance: She is well-developed. She is ill-appearing.     Comments: Uncomfortable   HENT:     Head: Normocephalic and atraumatic.  Eyes:     Conjunctiva/sclera: Conjunctivae normal.  Cardiovascular:     Rate and Rhythm: Regular rhythm. Tachycardia present.  Pulmonary:     Effort: Pulmonary effort is normal. No respiratory distress.     Breath sounds: Normal breath sounds.  Abdominal:     General: There is distension.     Tenderness: There is abdominal tenderness. There is guarding.     Comments: Abdomen appears gravid, bilateral diffuse  tenderness to palpation, guarding present  Genitourinary:    Comments: Mucus and purulence with some blood noted around the vagina.  Musculoskeletal:        General: No swelling.     Cervical back: Neck supple.  Skin:    General: Skin is warm and dry.     Capillary Refill: Capillary refill takes less than 2 seconds.  Neurological:     Mental Status: She is alert.  Psychiatric:        Mood and Affect: Mood normal.     ED Results / Procedures / Treatments   Labs (all labs ordered are listed, but only abnormal results are displayed) Labs Reviewed  LIPASE, BLOOD - Abnormal; Notable for the following components:      Result Value   Lipase 68 (*)    All other components within normal limits  COMPREHENSIVE METABOLIC PANEL - Abnormal; Notable for the following components:   CO2 17 (*)    Glucose, Bld 142 (*)    BUN 5 (*)    Albumin 3.4 (*)    All other components within normal limits  CBC - Abnormal; Notable for the following components:   WBC 19.8 (*)    Hemoglobin 11.7 (*)    HCT 34.6 (*)    All other components within normal limits  HCG, QUANTITATIVE, PREGNANCY - Abnormal; Notable  for the following components:   hCG, Beta Chain, Quant, [place] 82,505 (*)    All other components within normal limits  I-STAT BETA HCG BLOOD, ED ([facility], [facility], [facility] ONLY) - Abnormal; Notable for the following components:   I-stat hCG, quantitative >2,000.0 (*)    All other components within normal limits  RESP PANEL BY RT-PCR (RSV, FLU A&B, COVID)  RVPGX2  LACTIC ACID, PLASMA  URINALYSIS, ROUTINE W REFLEX MICROSCOPIC  PREGNANCY, URINE  LACTIC ACID, PLASMA  RAPID URINE DRUG SCREEN, HOSP PERFORMED  HEMOGLOBIN A1C  PROTEIN / CREATININE RATIO, URINE  HEPATITIS B SURFACE ANTIGEN  RUBELLA SCREEN  RPR  RAPID HIV SCREEN (HIV 1/2 AB+AG)  TYPE AND SCREEN  TYPE AND SCREEN  GC/CHLAMYDIA PROBE AMP ([place]) NOT AT [facility]    EKG None  Radiology No results found.  Procedures .Critical Care  Performed  by: Ernie Avena, MD Authorized by: Ernie Avena, MD   Critical care provider statement:    Critical care time (minutes):  124   Critical care was necessary to treat or prevent imminent or life-threatening deterioration of the following conditions: Precipitous Delivery.   Critical care was time spent personally by me on the following activities:  Development of treatment plan with patient or surrogate, discussions with consultants, evaluation of patient's response to treatment, examination of patient, ordering and review of laboratory studies, ordering and review of radiographic studies, ordering and performing treatments and interventions, pulse oximetry, re-evaluation of patient's condition and review of old charts Ultrasound ED OB Pelvic  Date/Time: 09/14/2022 4:36 PM  Performed by: Ernie Avena, MD Authorized by: Ernie Avena, MD   Procedure details:    Indications: evaluate for IUP     Assess:  Intrauterine pregnancy and fetal viability   Technique:  Transabdominal obstetric (HCG+) exam   Images: not archived   Study Limitations: body habitus and patient compliance Uterine findings:    Intrauterine pregnancy: identified     Single gestation: identified     Fetal heart rate: identified     Estimated gestational age: 33 weeks Left ovary findings:    Left ovary:  Unable to visualize    Right ovary findings:     Right ovary:  Unable to visualize    Comments:     Concern for intrauterine pregnancy, FHT appreciated at 138     Medications Ordered in ED Medications  azithromycin (ZITHROMAX) tablet 1,000 mg (has no administration in time range)  magnesium sulfate 40 grams in SWI 1000 mL OB infusion (2 g/hr Intravenous Rate/Dose Change 09/14/22 1520)  dextrose (GLUTOSE) 40 % oral gel (has no administration in time range)  lactated ringers infusion (has no administration in time range)  acetaminophen (TYLENOL) tablet 650 mg (has no administration in time range)  docusate  sodium (COLACE) capsule 100 mg (has no administration in time range)  calcium carbonate (TUMS - dosed in mg elemental calcium) chewable tablet 400 mg of elemental calcium (has no administration in time range)  prenatal multivitamin tablet 1 tablet (has no administration in time range)  enoxaparin (LOVENOX) injection 30 mg (has no administration in time range)  labetalol (NORMODYNE) injection 20 mg (has no administration in time range)    And  labetalol (NORMODYNE) injection 40 mg (has no administration in time range)    And  labetalol (NORMODYNE) injection 80 mg (has no administration in time range)    And  hydrALAZINE (APRESOLINE) injection 10 mg (has no administration in time range)  piperacillin-tazobactam (ZOSYN) IVPB 3.375 g (3.375  g Intravenous New Bag/Given 09/14/22 1540)  piperacillin-tazobactam (ZOSYN) IVPB 3.375 g (has no administration in time range)  NIFEdipine (PROCARDIA-XL/NIFEDICAL-XL) 24 hr tablet 30 mg (has no administration in time range)  lactated ringers bolus 1,000 mL (1,000 mLs Intravenous New Bag/Given 09/14/22 1314)  ondansetron (ZOFRAN) injection 4 mg (4 mg Intravenous Given 09/14/22 1337)  fentaNYL (SUBLIMAZE) injection 50 mcg (50 mcg Intravenous Given 09/14/22 1337)  magnesium bolus via infusion 4 g (4 g Intravenous Bolus from Bag 09/14/22 1458)  fentaNYL (SUBLIMAZE) injection 50 mcg (50 mcg Intravenous Given 09/14/22 1445)    ED Course/ Medical Decision Making/ A&P                           Medical Decision Making Amount and/or Complexity of Data Reviewed Labs: ordered. Radiology: ordered.  Risk Prescription drug management. Decision regarding hospitalization.    21 year old female presenting to the emergency department with roughly 2 days of abdominal pain.  She endorses a cramping and bloated sensation.  She endorses a last menstrual period of last week stating she had vaginal bleeding.  She states that her menstrual cycles are irregular.  Per EMS  report, she has had worsening abdominal pain over the last 2 days.  She states that is intermittently cramping bilaterally in her pelvis.  She endorses some vaginal discharge.  Denies any active vaginal bleeding or leakage of fluid.  She had an episode of NB emesis yesterday and endorses persistent nausea.  On arrival, the patient was afebrile, tachycardic P106, hemodynamically stable BP 131/74, saturating 100% on room air.  Physical exam concerning for potentially gravid abdomen with diffuse tenderness to palpation, guarding present.  The patient states that she is not pregnant.  She states that her last menstrual period was last week.  On chart review, the patient was told that she was pregnant in July during a family medicine office visit on 04/09/2022.  Counseling had been performed regarding her urine pregnancy being positive.  The patient states that she does not remember being told this.  She denies any recent history of rupture of membranes or leakage of fluid.  An i-STAT hCG was ordered and resulted positive.  Code OB was subsequently immediately called and a bedside ultrasound was performed which revealed what appeared to be a developed fetus likely close to third trimester although dating was difficult due to the fetus being in cephalic presentation and to have already been engaged in the pelvis.  Fetal heart tones were appreciated and tocometry and fetal heart monitoring was initiated.  A pelvic exam was then performed and the patient was found to be 9 cm dilated with a bulging bag.  Dr. Crissie Reese of obstetrics was called and L&D nursing arrived to evaluate the patient.  The fetus has had no prenatal care.  Concern for sepsis from chorioamnionitis.  Additionally concern for preterm labor and pre-eclampsia as the patient's blood pressure began to rise to hypertensive levels after fluid resuscitation.  Neonatalogy was paged stat and a team arrived to [facility] to arrive bedside I spoke with Dr.  Joycelyn Man who will be assisting bedside.  Antibiotics were ordered which were subsequently switched to Zosyn.  The patient was administered IV magnesium.  NICU transport team arrived to bedside to include RT, neonatal nurse practitioner and the neonatologist patient was delivered after AROM by obstetrics through purulent appearing amniotic fluid.  The infant was brought to the warmer bedside and the patient was ultimately intubated for persistent  respiratory distress after failing CPAP at 14 minutes of life.  The infant received surfactant. IV access was obtained by NICU team.   The infant was then transported to the NICU in critical but stabilized condition. Mother was also then admitted to postpartum at [facility] in stabilized condition.    Final Clinical Impression(s) / ED Diagnoses Final diagnoses:  Preterm labor with preterm delivery, single or unspecified fetus  Antepartum chorioamnionitis, single or unspecified fetus  Sepsis, due to unspecified organism, unspecified whether acute organ dysfunction present [facility])  Pre-eclampsia, antepartum    Rx / DC Orders ED Discharge Orders     None         Ernie Avena, MD 09/14/22 1919

## 2022-09-14 NOTE — ED Triage Notes (Addendum)
Pt arrives via [facility] from home, per report by medic, pt C/o abdominal pain worse over the past two days. Tender in the LUQ, positional, bloating. Hr 120 "irregularly regular" says she has "stress related" tachycardia, vitals 122/86, 99% RA, 24. Cbg 148. 1 episode of vomiting yesterday. 650 tylenol given en route.

## 2022-09-14 NOTE — ED Provider Triage Note (Signed)
Emergency Medicine Provider Triage Evaluation Note  Mary Zavala , a 21 y.o. female  was evaluated in triage.  Pt complains of lower back and lower abdominal pain.  Patient reports she began to have lower back pain last night.  The pain then radiated to her flanks and lower abdomen.  Patient reports cramping in her lower abdomen.  She reports nausea and vomiting today.  No fever, chest pain, shortness of breath, bowel changes, urinary symptoms.  Review of Systems  Positive: As above Negative: As above  Physical Exam  BP (!) 140/80   Pulse (!) 58   Temp 97.8 F (36.6 C) (Oral)   Resp 18   LMP 09/07/2022   SpO2 100%  Gen:   Awake, no distress   Resp:  Normal effort  MSK:   Moves extremities without difficulty  Other:  TTP to lumbar paraspinal muscles and lower abdomen.  Medical Decision Making  Medically screening exam initiated at 10:57 AM.  Appropriate orders placed.  Mary Zavala was informed that the remainder of the evaluation will be completed by another provider, this initial triage assessment does not replace that evaluation, and the importance of remaining in the ED until their evaluation is complete.     Mary Zavala, [place] 09/14/22 2013

## 2022-09-15 LAB — RPR: RPR Ser Ql: NONREACTIVE

## 2022-09-15 LAB — CBC
HCT: 25.5 % — ABNORMAL LOW (ref 36.0–46.0)
Hemoglobin: 8.8 g/dL — ABNORMAL LOW (ref 12.0–15.0)
MCH: 29.4 pg (ref 26.0–34.0)
MCHC: 34.5 g/dL (ref 30.0–36.0)
MCV: 85.3 fL (ref 80.0–100.0)
Platelets: 216 10*3/uL (ref 150–400)
RBC: 2.99 MIL/uL — ABNORMAL LOW (ref 3.87–5.11)
RDW: 12.9 % (ref 11.5–15.5)
WBC: 21 10*3/uL — ABNORMAL HIGH (ref 4.0–10.5)
nRBC: 0 % (ref 0.0–0.2)

## 2022-09-15 MED ORDER — FAMOTIDINE 20 MG PO TABS
20.0000 mg | ORAL_TABLET | Freq: Two times a day (BID) | ORAL | Status: DC
  Administered 2022-09-16: 20 mg via ORAL
  Filled 2022-09-15 (×2): qty 1

## 2022-09-15 MED ORDER — FERROUS SULFATE 325 (65 FE) MG PO TABS
325.0000 mg | ORAL_TABLET | ORAL | Status: DC
  Administered 2022-09-15: 325 mg via ORAL
  Filled 2022-09-15: qty 1

## 2022-09-15 MED ORDER — FUROSEMIDE 20 MG PO TABS
20.0000 mg | ORAL_TABLET | Freq: Every day | ORAL | Status: DC
  Administered 2022-09-15 – 2022-09-16 (×2): 20 mg via ORAL
  Filled 2022-09-15 (×2): qty 1

## 2022-09-15 MED ORDER — ALUM & MAG HYDROXIDE-SIMETH 200-200-20 MG/5ML PO SUSP
30.0000 mL | Freq: Once | ORAL | Status: AC
  Administered 2022-09-15: 30 mL via ORAL
  Filled 2022-09-15: qty 30

## 2022-09-15 MED ORDER — LIDOCAINE VISCOUS HCL 2 % MT SOLN
15.0000 mL | Freq: Once | OROMUCOSAL | Status: AC
  Administered 2022-09-15: 15 mL via ORAL
  Filled 2022-09-15: qty 15

## 2022-09-15 NOTE — Progress Notes (Signed)
Post Partum Day 1  Subjective: No complaints, voiding, and tolerating PO.  Patient denies any headaches, visual symptoms, RUQ/epigastric pain or other concerning symptoms.   Objective: Blood pressure 133/71, pulse 94, temperature 97.6 F (36.4 C), temperature source Oral, resp. rate 18, height 5\' 10"  (1.778 m), weight 106.6 kg, last menstrual period 09/07/2022, SpO2 100 %. Patient Vitals for the past 24 hrs:  BP Temp Temp src Pulse Resp SpO2 Height Weight  09/15/22 0809 133/71 97.6 F (36.4 C) Oral 94 18 100 % -- --  09/15/22 0652 -- -- -- -- 18 -- -- --  09/15/22 0529 -- -- -- -- 16 -- -- --  09/15/22 0501 -- -- -- -- 16 -- -- --  09/15/22 0412 -- -- -- -- 15 -- -- --  09/15/22 0306 (!) 106/48 97.6 F (36.4 C) Oral 78 16 100 % -- --  09/15/22 0157 -- -- -- -- 17 -- -- --  09/15/22 0005 -- -- -- -- 16 -- -- --  09/14/22 2257 131/79 98.1 F (36.7 C) Oral 96 18 100 % -- --  09/14/22 2159 123/70 97.6 F (36.4 C) Oral (!) 103 15 100 % -- --  09/14/22 2055 133/73 97.9 F (36.6 C) Oral 98 20 100 % -- --  09/14/22 1945 129/74 98.1 F (36.7 C) Oral 92 18 100 % 5\' 10"  (1.778 m) 106.6 kg  09/14/22 1834 (!) 142/95 98.1 F (36.7 C) Oral (!) 112 18 100 % -- --  09/14/22 1728 (!) 154/105 98.4 F (36.9 C) Oral (!) 107 18 100 % -- --  09/14/22 1615 135/80 -- -- (!) 105 -- 100 % -- --  09/14/22 1545 (!) 112/100 -- -- (!) 108 -- 100 % -- --  09/14/22 1530 (!) 132/94 -- -- (!) 108 18 100 % -- --  09/14/22 1515 (!) 146/83 -- -- (!) 103 -- 100 % -- --  09/14/22 1500 (!) 144/68 -- -- (!) 107 -- 100 % -- --  09/14/22 1430 (!) 162/79 -- -- (!) 115 -- -- -- --  09/14/22 1330 (!) 134/112 -- -- 100 18 100 % -- --  09/14/22 1300 (!) 148/83 -- -- 96 18 99 % -- --  09/14/22 1230 (!) 144/94 -- -- (!) 119 18 100 % -- --  09/14/22 1215 138/79 -- -- (!) 107 18 99 % -- --  09/14/22 0948 -- 97.8 F (36.6 C) Oral -- -- -- -- --  09/14/22 0946 (!) 140/80 -- -- (!) 58 18 100 % -- --    Physical Exam:   General: alert and no distress Lochia: appropriate Uterine Fundus: firm DVT Evaluation: No evidence of DVT seen on physical exam. Negative Homan's sign.  Recent Labs    09/14/22 0621 09/15/22 0518  HGB 11.7* 8.8*  HCT 34.6* 25.5*    Assessment/Plan: 1) Severe Preeclampsia: Stable BP for now, no symptoms.  Continue Procardia XL 30 mg and Lasix 20 mg daily.   2) Acute blood loss anemia - Oral iron therapy started   3)No prenatal care: SW consulted  4) Postpartum care -  Desires Depo Provera for contraception - Encourage OOB, analgesia as needed - Continue routine postpartum care   LOS: 1 day   09/16/22, MD 09/15/2022, 9:09 AM

## 2022-09-16 ENCOUNTER — Ambulatory Visit: Payer: Self-pay

## 2022-09-16 ENCOUNTER — Other Ambulatory Visit (HOSPITAL_COMMUNITY): Payer: Self-pay

## 2022-09-16 DIAGNOSIS — O0930 Supervision of pregnancy with insufficient antenatal care, unspecified trimester: Secondary | ICD-10-CM

## 2022-09-16 DIAGNOSIS — O411290 Chorioamnionitis, unspecified trimester, not applicable or unspecified: Secondary | ICD-10-CM | POA: Diagnosis present

## 2022-09-16 LAB — GC/CHLAMYDIA PROBE AMP (~~LOC~~) NOT AT ARMC
Chlamydia: NEGATIVE
Comment: NEGATIVE
Comment: NORMAL
Neisseria Gonorrhea: NEGATIVE

## 2022-09-16 LAB — RUBELLA SCREEN: Rubella: 1.83 index (ref >0.99)

## 2022-09-16 MED ORDER — SENNOSIDES-DOCUSATE SODIUM 8.6-50 MG PO TABS
2.0000 | ORAL_TABLET | Freq: Every day | ORAL | 2 refills | Status: DC
  Filled 2022-09-16: qty 30, 15d supply, fill #0
  Filled 2022-10-13 – 2022-10-21 (×2): qty 30, 15d supply, fill #1

## 2022-09-16 MED ORDER — FERROUS SULFATE 325 (65 FE) MG PO TABS
325.0000 mg | ORAL_TABLET | ORAL | 3 refills | Status: DC
  Filled 2022-09-16: qty 30, 60d supply, fill #0
  Filled 2022-10-21: qty 30, 60d supply, fill #1

## 2022-09-16 MED ORDER — CYCLOBENZAPRINE HCL 10 MG PO TABS
10.0000 mg | ORAL_TABLET | Freq: Three times a day (TID) | ORAL | 2 refills | Status: DC | PRN
  Filled 2022-09-16: qty 30, 10d supply, fill #0
  Filled 2022-10-21: qty 30, 10d supply, fill #1

## 2022-09-16 MED ORDER — CYCLOBENZAPRINE HCL 10 MG PO TABS
10.0000 mg | ORAL_TABLET | Freq: Once | ORAL | Status: AC
  Administered 2022-09-16: 10 mg via ORAL
  Filled 2022-09-16: qty 1

## 2022-09-16 MED ORDER — FUROSEMIDE 20 MG PO TABS
20.0000 mg | ORAL_TABLET | Freq: Every day | ORAL | 0 refills | Status: DC
  Filled 2022-09-16: qty 3, 3d supply, fill #0

## 2022-09-16 MED ORDER — AMOXICILLIN-POT CLAVULANATE 875-125 MG PO TABS
1.0000 | ORAL_TABLET | Freq: Two times a day (BID) | ORAL | 0 refills | Status: AC
  Filled 2022-09-16: qty 10, 5d supply, fill #0

## 2022-09-16 MED ORDER — NIFEDIPINE ER 30 MG PO TB24
30.0000 mg | ORAL_TABLET | Freq: Every day | ORAL | 2 refills | Status: DC
  Filled 2022-09-16: qty 30, 30d supply, fill #0
  Filled 2022-10-13: qty 30, 30d supply, fill #1

## 2022-09-16 MED ORDER — IBUPROFEN 600 MG PO TABS
600.0000 mg | ORAL_TABLET | Freq: Four times a day (QID) | ORAL | 0 refills | Status: DC | PRN
  Filled 2022-09-16: qty 60, 15d supply, fill #0

## 2022-09-16 MED ORDER — PRENATAL MULTIVITAMIN CH
1.0000 | ORAL_TABLET | Freq: Every day | ORAL | 3 refills | Status: DC
  Filled 2022-09-16: qty 30, 30d supply, fill #0
  Filled 2022-10-13: qty 30, 30d supply, fill #1

## 2022-09-16 NOTE — Lactation Note (Signed)
This note was copied from a baby's chart. Lactation Consultation Note  Patient Name: Mary Zavala NOMVE'H Date: 09/16/2022 Reason for consult: Initial assessment;Primapara;Mother's request;NICU baby;Preterm <34wks;Infant < 6lbs (Social worker that interview the Birth mother asked LC to see this mom at her request.) Age:23 hours LC visited birth parent in her room 64.  She asked about baby receiving donor milk. LC informed parent of baby receiving while she was in NICU and would be transitioned to formula afterwards when going home.  After discussion of supply and demand, pumping consistently,  per birth parent decided she didn't want to breast feed.  LC reviewed the ways to dry up her milk if it comes in.    Interventions Interventions: Education     Consult Status Consult Status: Complete Date: 09/16/22    Kathrin Greathouse 09/16/2022, 3:14 PM

## 2022-09-16 NOTE — Progress Notes (Signed)
Patient stated that she felt light headed on her return from NICU, Patient had ambulated. Encouraged patient to stay in bed and rest. Will medicate with Tylenol. Will continue to monitor.

## 2022-09-16 NOTE — Progress Notes (Signed)
Patient stated that she feels better.

## 2022-09-16 NOTE — Discharge Summary (Signed)
Postpartum Discharge Summary    Patient Name: Mary Zavala DOB: [birth year] MRN: [id number]  Date of admission: 09/14/2022 Delivery date:09/14/2022  Delivering provider:   Date of discharge: 09/16/2022  Admitting diagnosis: Pre-eclampsia, antepartum [O14.90] Pre-eclampsia, severe, postpartum condition [O14.15] Preterm labor with preterm delivery, single or unspecified fetus [O60.10X0] Antepartum chorioamnionitis, single or unspecified fetus [O41.1290] Sepsis, due to unspecified organism, unspecified whether acute organ dysfunction present (HCC) [A41.9] Hypertension affecting pregnancy [O16.9] Intrauterine pregnancy: Unknown     Secondary diagnosis:  Principal Problem:   Pre-eclampsia, severe, postpartum condition Active Problems:   Hypertension affecting pregnancy   Chorioamnionitis, delivered   No prenatal care in current pregnancy   Sepsis was ruled out  Additional problems: None    Discharge diagnosis: Preterm Pregnancy Delivered                                              Post partum procedures: None Augmentation:  None Complications: None  Hospital course: Onset of Labor With Vaginal Delivery      21 y.o. yo G1P0 at unknown gestational age presented to [facility] ER  with abdominal pain and was found to be in Active Labor on 09/14/2022. She reported that she did not know she was pregnant.  Labor course was complicated by intrauterine infection (chorioamnionitis) diagnosed during delivery. This was concerning for sepsis, but this was subsequently ruled out.  She also was noted to have very severe range BP and required IV antihypertensives, magnesium sulfate was started for eclampsia prophylaxis.  Patient progressed to second stage of labor and delivery while in ER, after the arrival of OB and NICU teams.  Details of delivery can be found in separate delivery note.  Patient received postpartum magnesium sulfate for eclampsia prophylaxis as per protocol. BP control was obtained with  Procardia XL 30 mg qd and she was started on Lasix 20 mg po bid x 5 days, there were no further immediate complications.  Otherwise, patient had a routine postpartum course. She is ambulating, tolerating a regular diet, passing flatus, and urinating well. Patient is discharged home in stable condition on 09/16/2022, and will follow up in   Physical exam  Vitals:   09/15/22 2342 09/16/22 0425 09/16/22 0813 09/16/22 1223  BP: (!) 103/49 (!) 113/57 125/67 139/79  Pulse: 68 78 70 80  Resp: 18 18 18 18   Temp: 97.9 F (36.6 C) 98.2 F (36.8 C) 98 F (36.7 C) 98.2 F (36.8 C)  TempSrc: Oral Oral Oral Oral  SpO2: 100% 100% 100% 100%  Weight:      Height:       General: alert, cooperative, and no distress Lochia: appropriate Uterine Fundus: firm Incision: N/A DVT Evaluation: No evidence of DVT seen on physical exam. Negative Homan's sign. No cords or calf tenderness. No significant calf/ankle edema. Labs: Lab Results  Component Value Date   WBC 21.0 (H) 09/15/2022   HGB 8.8 (L) 09/15/2022   HCT 25.5 (L) 09/15/2022   MCV 85.3 09/15/2022   PLT 216 09/15/2022      Latest Ref Rng & Units 09/14/2022    6:21 AM  CMP  Glucose 70 - 99 mg/dL 09/16/2022   BUN 6 - 20 mg/dL 5   Creatinine 545 - 6.25 mg/dL 6.38   Sodium 9.37 - 342 mmol/L 135   Potassium 3.5 - 5.1 mmol/L 3.5   Chloride 98 -  111 mmol/L 108   CO2 22 - 32 mmol/L 17   Calcium 8.9 - 10.3 mg/dL 9.3   Total Protein 6.5 - 8.1 g/dL 7.9   Total Bilirubin 0.3 - 1.2 mg/dL 1.0   Alkaline Phos 38 - 126 U/L 106   AST 15 - 41 U/L 15   ALT 0 - 44 U/L 11    Edinburgh Score:    09/15/2022    2:00 PM  Edinburgh Postnatal Depression Scale Screening Tool  I have been able to laugh and see the funny side of things. 0  I have looked forward with enjoyment to things. 1  I have blamed myself unnecessarily when things went wrong. 0  I have been anxious or worried for no good reason. 2  I have felt scared or panicky for no good reason. 2   Things have been getting on top of me. 1  I have been so unhappy that I have had difficulty sleeping. 1  I have felt sad or miserable. 1  I have been so unhappy that I have been crying. 0  The thought of harming myself has occurred to me. 1  Edinburgh Postnatal Depression Scale Total 9      After visit meds:  Allergies as of 09/16/2022   No Known Allergies      Medication List     TAKE these medications    amoxicillin-clavulanate 875-125 MG tablet Commonly known as: AUGMENTIN Take 1 tablet by mouth 2 (two) times daily for 5 days.   cyclobenzaprine 10 MG tablet Commonly known as: FLEXERIL Take 1 tablet (10 mg total) by mouth 3 (three) times daily as needed for muscle spasms.   FeroSul 325 (65 FE) MG tablet Generic drug: ferrous sulfate Take 1 tablet (325 mg total) by mouth every other day. Start taking on: September 17, 2022   furosemide 20 MG tablet Commonly known as: LASIX Take 1 tablet (20 mg total) by mouth daily for 3 days.   ibuprofen 600 MG tablet Commonly known as: ADVIL Take 1 tablet (600 mg total) by mouth every 6 (six) hours as needed for mild pain, moderate pain, cramping, headache or fever.   NIFEdipine 30 MG 24 hr tablet Commonly known as: ADALAT CC Take 1 tablet (30 mg total) by mouth daily.   Prenatal 27-1 MG Tabs Take 1 tablet by mouth daily at 12 noon.   Senexon-S 8.6-50 MG tablet Generic drug: senna-docusate Take 2 tablets by mouth daily.         Discharge home in stable condition Infant Feeding: Breast Infant Disposition:NICU Discharge instruction: per After Visit Summary and Postpartum booklet. Activity: Advance as tolerated. Pelvic rest for 6 weeks.  Diet: routine diet Anticipated Birth Control: Unsure Future Appointments: Future Appointments  Date Time Provider Department Center  09/23/2022 10:15 AM [facility] HEALTH CLINICIAN [facility]  10/21/2022  1:35 PM Adam Phenix, MD [facility]          09/16/2022 Jaynie Collins, MD

## 2022-09-16 NOTE — Progress Notes (Signed)
   09/16/22 1300  Clinical Encounter Type  Visited With Patient  Visit Type Initial;Spiritual support  Referral From Chaplain  Consult/Referral To Chaplain   Chaplain responded to a request from the overnight chaplain to meet with the patient, Mary Zavala. She is committed to being a mom and with that concerned with all the details that need to be addressed as she prepares to go home with her baby. Mary Zavala is taking in all the information that is being shared and asking questions. She is ok right now. I offered to returned if she decided she wanted to talk later.   Valerie Roys [facility]  [phone]

## 2022-09-16 NOTE — Clinical Social Work Maternal (Signed)
CLINICAL SOCIAL WORK MATERNAL/CHILD NOTE  Patient Details  Name: Mary Zavala MRN: [id number] Date of Birth: May 25, 2001  Date:  09/16/2022  Clinical Social Worker Initiating Note:  Idamae Lusher, [place] Date/Time: Initiated:  09/16/22/1437     Child's Name:  Mary Zavala   Biological Parents:  Mother   Need for Interpreter:  None   Reason for Referral:  Current Substance Use/Substance Use During Pregnancy  , Late or No Prenatal Care     Address:  [place] [address]    Phone number:  [phone] (home)     Additional phone number:   Household Members/Support Persons (HM/SP):   Household Member/Support Person 1, Household Member/Support Person 2   HM/SP Name Relationship DOB or Age  HM/SP -70 Essence Chuichu Twin sister 88  HM/SP -2 [name] Williamson Sister 25  HM/SP -3        HM/SP -4        HM/SP -5        HM/SP -6        HM/SP -7        HM/SP -8          Natural Supports (not living in the home):  Immediate Family   Professional Supports: None   Employment: Animator   Type of Work: Freight forwarder at [employer]:  [place] arranged:    Museum/gallery curator Resources:  [employer]   Other Resources:  [employer] ([place] referral placed during consult)   Cultural/Religious Considerations Which May Impact Care:  None identified  Strengths:  Ability to meet basic needs     Psychotropic Medications:         Pediatrician:     List provided  Pediatrician List:   [place]      Pediatrician Fax Number:    Risk Factors/Current Problems:  Substance Use  , Mental Health Concerns  , Other (Comment) (MOB needs all essential items for infant)   Cognitive State:  Able to Concentrate  , Linear Thinking  , Alert  , Goal Oriented     Mood/Affect:  Anxious  , Comfortable  , Interested     CSW Assessment: CSW was consulted due to no prenatal  care, MOB being unaware that she was pregnant, and answering "Hardly ever" to question 10 on Edinburgh Postnatal Depression Scale questionnaire. CSW met with MOB at bedside to complete assessment. When CSW entered room, MOB was observed sitting in hospital bed. MOB's older sister arrived during the end of the consult, MOB provided verbal consent to complete consult with sister present. Infant was rooming in NICU. CSW introduced self and explained reasons for consult. MOB was agreeable to consult and remained engaged throughout encounter.   CSW inquired about MOB's feelings towards infant's arrival. MOB explained that she still is in shock, sharing that she did not know that she was pregnant. Per chart review, MOB had a positive urine pregnancy test 04/09/22 and was seen at a medical clinic shortly after on 05/13/22, reporting that she had terminated the pregnancy; however, no follow up pregnancy test was performed. MOB did not share this information during consult. MOB shares that once infant was born, she felt gratitude that infant is going to be okay and proudly shared how well he is progressing in the NICU. MOB shares she is feeling scared and wants to  make sure she is doing everything she can to be there for infant. CSW provided active listening, validated and normalized MOB's emotions. CSW reviewed MOB's Edinburgh scores with MOB and inquired about MOB answering "hardly ever" to question 10. MOB denies current SI, sharing that she endorsed passive SI without a plan 1 month ago. CSW assessed further. MOB shares that she recently bought a house and was feeling stressed regarding money and "adulting." MOB shares she would never act on her passive SI thoughts. MOB states she is not current with psychotropic medication and is not connected with a therapist. MOB was agreeable to outpatient mental health resources, which CSW provided.   CSW inquired about MOB's mental health history. MOB reports she has a diagnosis  of anxiety which began in middle school, marked by being diagnosed with an irregular heartbeat. MOB shares her anxiety symptoms are marked by "moving too fast", feeling fidgety, tense in her body, and having racing thoughts. MOB shares she feels she has managed her stress well since she moved from [place] last year. MOB identified using breathing exercises as a coping skill to manage anxiety. MOB shares she has difficulty opening up to others and can find it hard to ask others for help. CSW commended MOB for her insight. MOB acknowledged that she knows she needs to accept help with infant's arrival and shares she is open to resources and support at this time. MOB denied current SI/HI/DV. MOB shares her sister and MOB are supportive towards her and excited about infant's arrival.   CSW provided education regarding the baby blues period vs. perinatal mood disorders, discussed treatment and gave resources for mental health follow up if concerns arise.  CSW recommends self-evaluation during the postpartum time period using the New Mom Checklist from Postpartum Progress and encouraged MOB to contact a medical professional if symptoms are noted at any time.    MOB reports she is in need of all essential items for infant, including a car seat and crib. CSW explained that if MOB has exhausted all resources, the hospital can provide a car seat for $30 and pack n play free of charge closer to infant's anticipated discharge date. MOB states she may need a car seat and pack n play provided by the hospital. CSW provided information about [employer] Network and [employer].CSW placed referral to [employer] per MOB's request. MOB shares her employer is supportive of MOB taking time off from work but MOB states she is unsure if she will be provided maternity leave compensation. CSW provided MOB with information for [employer] to apply for financial assistance if need arises. MOB  also expressed interest in [employer], CSW provided contact information. MOB inquired about donor milk post discharge. CSW offered to have lactation follow up regarding infant feeding support, MOB expressed appreciation. CSW notified MOB's nurse of MOB's lactation consult request. MOB reports she is interested in applying for food stamps and WIC. CSW provided MOB with the online food stamps application link and placed a referral for [facility] with MOB's consent. MOB is undecided on a pediatrician, CSW provided a list.   CSW and MOB discussed infant's NICU admission. CSW informed MOB about the NICU, what to expect, and resources/supports available while infant is admitted to the NICU. MOB reported that she feels updated about infant's care. MOB denied any transportation barriers with visiting infant in the NICU. CSW offered to check in with MOB or have MOB contact social work if needs arise.  MOB states she will check in with social work as needed.   CSW informed MOB about hospital drug screen policy due to no prenatal care and explained that due to infant's UDS resulting positive for marijuana, a CPS report would be made and CDS would continue to be monitored. MOB expressed understanding. CSW inquired about substance use/barriers to prenatal care during pregnancy. 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 1 year ago and due to marijuana being legal in [place], she became accustomed to smoking marijuana. MOB denies using other illicit substances during pregnancy. As mentioned above, MOB reported that she was unaware that she was pregnant until presenting to the hospital with contractions and in labor.   CSW contacted [place] and made a CPS report to social worker Redmond Pulling due to infant's UDS resulting positive for marijuana. Per social worker Redmond Pulling, no  barriers to infant's discharge. CPS to place a [place] referral.   Due to infant's gestational age, CSW will provide review Sudden Infant Death Syndrome (SIDS) precautions closer to infant's discharge.  Once MOB secures essential items for infant, CSW identifies no barriers to discharge. CSW will continue to offer support and resources to family while infant remains in NICU.   CSW Plan/Description:  No Further Intervention Required/No Barriers to Discharge, Psychosocial Support and Ongoing Assessment of Needs, Perinatal Mood and Anxiety Disorder (PMADs) Education, [place], Child Protective Service Report  , CSW Will Continue to Monitor Umbilical Cord Tissue Drug Screen Results and Make Report if Warranted, Other Information/Referral to [place], [place] 09/16/2022, 3:04 PM

## 2022-09-17 NOTE — BH Specialist Note (Signed)
Pt did not arrive to video visit and did not answer the phone; Left HIPPA-compliant message to call back Dillie Burandt from [facility] at  [phone] (Ivalee Strauser's office).  ?; left MyChart message for patient.  ? ?

## 2022-09-18 LAB — SURGICAL PATHOLOGY

## 2022-09-23 ENCOUNTER — Ambulatory Visit: Payer: Medicaid Other | Admitting: Clinical

## 2022-09-23 DIAGNOSIS — Z91199 Patient's noncompliance with other medical treatment and regimen due to unspecified reason: Secondary | ICD-10-CM

## 2022-10-01 NOTE — BH Specialist Note (Signed)
Integrated Behavioral Health via Telemedicine Visit  10/06/2022 Mary Zavala [id number]  Number of [place] Clinician visits: 1- Initial Visit  Session Start time: 0819   Session End time: 1884  Total time in minutes: 38   Referring Provider: Verita Schneiders, MD Patient/Family location: Home [facility] Provider location: [facility] [place] [facility]  All persons participating in visit: Patient Mary Zavala and Mary Zavala   Types of Service: Individual psychotherapy and Telephone visit  I connected with Inika A Hannon and/or Kinslea A Pichette's  n/a  via  Telephone or Geologist, engineering  (Video is Tree surgeon) and verified that I am speaking with the correct person using two identifiers. Discussed confidentiality: Yes   I discussed the limitations of telemedicine and the availability of in person appointments.  Discussed there is a possibility of technology failure and discussed alternative modes of communication if that failure occurs.  I discussed that engaging in this telemedicine visit, they consent to the provision of behavioral healthcare and the services will be billed under their insurance.  Patient and/or legal guardian expressed understanding and consented to Telemedicine visit: Yes   Presenting Concerns: Patient and/or family reports the following symptoms/concerns: Anxiety, worry, poor concentration, restless; attributes to unexpected and traumatic childbirth (went to hospital for what she thought was bad cramps/bleeding, waited alone for six hours in ED in pain prior to transfer to [facility]; found out she was pregnant when told to push) and adjusting to motherhood with baby in NICU. Pt also concerned about bad headaches (behind eyes, hurts to touch face, ibuprofen not helping, lightheadedness comes and goes for days).  Duration of problem: Three weeks; Severity of problem:  moderate  Patient and/or Family's Strengths/Protective Factors: Social connections, Concrete supports in place (healthy food, safe environments, etc.), and Sense of purpose  Goals Addressed: Patient will:  Reduce symptoms of: anxiety and stress   Increase knowledge and/or ability of: healthy habits   Demonstrate ability to: Increase healthy adjustment to current life circumstances and Increase adequate support systems for patient/family  Progress towards Goals: Ongoing  Interventions: Interventions utilized:  Psychoeducation and/or Health Education, Link to [employer], and Supportive Reflection Standardized Assessments completed: GAD-7 and PHQ 9  Patient and/or Family Response: Patient agrees with treatment plan.   Assessment: Patient currently experiencing Adjustment disorder with anxiety.   Patient may benefit from psychoeducation and brief therapeutic interventions regarding coping with symptoms of anxiety .  Plan: Follow up with behavioral health clinician on : Two weeks Behavioral recommendations:  -Continue prioritizing healthy self-care (regular meals, adequate rest; allowing practical help from supportive friends and family) until at least postpartum medical appointment -Consider new mom support group as needed at either [email or web address] or [email or web address]   Referral(s): [place] (In Clinic) and [employer]:  new mom support  I discussed the assessment and treatment plan with the patient and/or parent/guardian. They were provided an opportunity to ask questions and all were answered. They agreed with the plan and demonstrated an understanding of the instructions.   They were advised to call back or seek an in-person evaluation if the symptoms worsen or if the condition fails to improve as anticipated.  Garlan Fair, LCSW     10/06/2022    8:41 AM 04/09/2022    3:35 PM 05/03/2020    9:11 AM  Depression screen  PHQ 2/9  Decreased Interest 0 1 0  Down, Depressed, Hopeless 1 0 0  PHQ -  2 Score 1 1 0  Altered sleeping 1    Tired, decreased energy 0    Change in appetite 0    Feeling bad or failure about yourself  0    Trouble concentrating 3    Moving slowly or fidgety/restless 1    Suicidal thoughts 0    PHQ-9 Score 6        10/06/2022    8:36 AM 04/09/2022    3:35 PM 05/03/2020    9:11 AM  GAD 7 : Generalized Anxiety Score  Nervous, Anxious, on Edge 3 1 0  Control/stop worrying 1 0 0  Worry too much - different things 3 0 0  Trouble relaxing 3 1 1   Restless 3 0 0  Easily annoyed or irritable 0 1 2  Afraid - awful might happen 1 0 0  Total GAD 7 Score 14 3 3   Anxiety Difficulty  Not difficult at all Not difficult at all

## 2022-10-06 ENCOUNTER — Ambulatory Visit (INDEPENDENT_AMBULATORY_CARE_PROVIDER_SITE_OTHER): Payer: BC Managed Care – PPO | Admitting: Clinical

## 2022-10-06 DIAGNOSIS — F4322 Adjustment disorder with anxiety: Secondary | ICD-10-CM

## 2022-10-06 NOTE — Patient Instructions (Signed)
[facility] [address] [place],  [address] [phone] (main office) [phone] (Jlee Harkless's office)  New Parent Support Groups [email or web address]   

## 2022-10-07 ENCOUNTER — Inpatient Hospital Stay (HOSPITAL_COMMUNITY)
Admission: AD | Admit: 2022-10-07 | Discharge: 2022-10-07 | Disposition: A | Discharge status: Home / Self Care | Payer: BC Managed Care – PPO | Attending: Obstetrics and Gynecology | Admitting: Obstetrics and Gynecology

## 2022-10-07 ENCOUNTER — Encounter (HOSPITAL_COMMUNITY): Payer: Self-pay | Admitting: *Deleted

## 2022-10-07 DIAGNOSIS — O9089 Other complications of the puerperium, not elsewhere classified: Secondary | ICD-10-CM | POA: Insufficient documentation

## 2022-10-07 DIAGNOSIS — R519 Headache, unspecified: Secondary | ICD-10-CM | POA: Insufficient documentation

## 2022-10-07 DIAGNOSIS — I493 Ventricular premature depolarization: Secondary | ICD-10-CM | POA: Diagnosis not present

## 2022-10-07 DIAGNOSIS — O9943 Diseases of the circulatory system complicating the puerperium: Secondary | ICD-10-CM | POA: Diagnosis not present

## 2022-10-07 LAB — COMPREHENSIVE METABOLIC PANEL
ALT: 16 U/L (ref 0–44)
AST: 18 U/L (ref 15–41)
Albumin: 3.4 g/dL — ABNORMAL LOW (ref 3.5–5.0)
Alkaline Phosphatase: 62 U/L (ref 38–126)
Anion gap: 7 (ref 5–15)
BUN: 9 mg/dL (ref 6–20)
CO2: 26 mmol/L (ref 22–32)
Calcium: 8.9 mg/dL (ref 8.9–10.3)
Chloride: 104 mmol/L (ref 98–111)
Creatinine, Ser: 0.73 mg/dL (ref 0.44–1.00)
GFR, Estimated: >60 mL/min (ref >60)
Glucose, Bld: 81 mg/dL (ref 70–99)
Potassium: 4.1 mmol/L (ref 3.5–5.1)
Sodium: 137 mmol/L (ref 135–145)
Total Bilirubin: 0.8 mg/dL (ref 0.3–1.2)
Total Protein: 6.8 g/dL (ref 6.5–8.1)

## 2022-10-07 LAB — CBC WITH DIFFERENTIAL/PLATELET
Abs Immature Granulocytes: 0.02 10*3/uL (ref 0.00–0.07)
Basophils Absolute: 0 10*3/uL (ref 0.0–0.1)
Basophils Relative: 0 %
Eosinophils Absolute: 0.1 10*3/uL (ref 0.0–0.5)
Eosinophils Relative: 2 %
HCT: 36.2 % (ref 36.0–46.0)
Hemoglobin: 12.4 g/dL (ref 12.0–15.0)
Immature Granulocytes: 0 %
Lymphocytes Relative: 31 %
Lymphs Abs: 2.2 10*3/uL (ref 0.7–4.0)
MCH: 29.6 pg (ref 26.0–34.0)
MCHC: 34.3 g/dL (ref 30.0–36.0)
MCV: 86.4 fL (ref 80.0–100.0)
Monocytes Absolute: 0.5 10*3/uL (ref 0.1–1.0)
Monocytes Relative: 8 %
Neutro Abs: 4.2 10*3/uL (ref 1.7–7.7)
Neutrophils Relative %: 59 %
Platelets: 276 10*3/uL (ref 150–400)
RBC: 4.19 MIL/uL (ref 3.87–5.11)
RDW: 13.2 % (ref 11.5–15.5)
WBC: 7.1 10*3/uL (ref 4.0–10.5)
nRBC: 0 % (ref 0.0–0.2)

## 2022-10-07 LAB — MAGNESIUM: Magnesium: 2.1 mg/dL (ref 1.7–2.4)

## 2022-10-07 MED ORDER — ACETAMINOPHEN-CAFFEINE 500-65 MG PO TABS
2.0000 | ORAL_TABLET | Freq: Once | ORAL | Status: AC
  Administered 2022-10-07: 2 via ORAL
  Filled 2022-10-07: qty 2

## 2022-10-07 MED ORDER — ACETAMINOPHEN-CAFFEINE 500-65 MG PO TABS
1.0000 | ORAL_TABLET | Freq: Once | ORAL | Status: DC
  Filled 2022-10-07: qty 1

## 2022-10-07 NOTE — MAU Provider Note (Addendum)
History     CSN: [id number]  Arrival date and time: 10/07/22 1030   Event Date/Time   First Provider Initiated Contact with Patient 10/07/22 1128      Chief Complaint  Patient presents with   Headache   Fatigue   Mary Zavala , a  22 y.o. G1P1 at 3 weeks PP presents to MAU with complaints of an on-going headache, weakness and fatigue for the past week. Patient states "she just feels off." She reports a "throbbing" headache behind her eyes and at her temples that is worsened by light and sound. She states she attempted PO Ibuprofen and her normal dose of 30 PO Procardia at 1015 this morning and has not started to feel relief. She currently rates her headache a 6/10. She endorses some bilateral upper abdominal pain that was occurring last night, but none now. She described that pain as "prickly." She denies an increase in swelling. She also reports that she was not getting good rest while being in the NICU but noted its "been netter" since she has been forcing herself to go home.   She also complains of increased fatigue and weakness. She states she just feels weak waking from the bed to the door. "Like she is going to pass out" She denies syncopal episodes. She endorses some light headedness and dizziness and noted that she feels like she has to "manually" take a breath versus doing "it automatically." She denies shortness of breath and denies chest pain or difficulty breathing. Diet recall today : Nothing. Least meal last night          OB History     Gravida  1   Para  1   Term      Preterm      AB      Living  1      SAB      IAB      Ectopic      Multiple      Live Births  1           Past Medical History:  Diagnosis Date   Heart murmur     History reviewed. No pertinent surgical history.  Family History  Problem Relation Age of Onset   Hypertension Mother    Asthma Mother    Asthma Sister    Asthma Brother    Diabetes Maternal Grandmother     Arthritis Maternal Grandfather    Diabetes Paternal Grandmother    Hypertension Paternal Grandmother    Kidney disease Paternal Grandmother    Arthritis Paternal Grandmother     Social History   Tobacco Use   Smoking status: Some Days    Types: Cigars   Smokeless tobacco: Never  Vaping Use   Vaping Use: Former   Quit date: 08/28/2021  Substance Use Topics   Alcohol use: Not Currently    Alcohol/week: 2.0 standard drinks of alcohol    Types: 2 Glasses of wine per week   Drug use: Not Currently    Frequency: 17.0 times per week    Types: Marijuana    Comment: per patient, 2x per day    Allergies: No Known Allergies  No medications prior to admission.    Review of Systems  Constitutional:  Negative for chills, fatigue and fever.  Eyes:  Negative for pain and visual disturbance.  Respiratory:  Negative for apnea, shortness of breath and wheezing.   Cardiovascular:  Negative for chest pain and palpitations.  Gastrointestinal:  Negative for abdominal pain, constipation, diarrhea, nausea and vomiting.  Genitourinary:  Negative for difficulty urinating, dysuria, pelvic pain, vaginal bleeding, vaginal discharge and vaginal pain.  Musculoskeletal:  Negative for back pain.  Neurological:  Positive for dizziness, weakness, light-headedness and headaches. Negative for seizures.  Psychiatric/Behavioral:  Negative for suicidal ideas.    Physical Exam   Blood pressure 110/67, pulse 84, temperature 98.7 F (37.1 C), temperature source Oral, resp. rate 20, height 5\' 10"  (1.778 m), weight 106.6 kg, last menstrual period 09/07/2022, SpO2 99 %, not currently breastfeeding.  Physical Exam Vitals and nursing note reviewed.  Constitutional:      General: She is not in acute distress.    Appearance: Normal appearance.  HENT:     Head: Normocephalic.  Cardiovascular:     Rate and Rhythm: Normal rate and regular rhythm.  Pulmonary:     Effort: Pulmonary effort is normal.   Musculoskeletal:        General: No swelling. Normal range of motion.     Cervical back: Normal range of motion.  Skin:    General: Skin is warm and dry.     Capillary Refill: Capillary refill takes less than 2 seconds.  Neurological:     Mental Status: She is alert and oriented to person, place, and time.     GCS: GCS eye subscore is 4. GCS verbal subscore is 5. GCS motor subscore is 6.     Motor: No weakness.     Coordination: Coordination normal.     Deep Tendon Reflexes: Reflexes normal.  Psychiatric:        Mood and Affect: Mood normal.     MAU Course  Procedures Orders Placed This Encounter  Procedures   CBC with Differential/Platelet   Comprehensive metabolic panel   Magnesium   AMB Referral to [facility]   ED EKG   Discharge patient   Patient Vitals for the past 24 hrs:  BP Temp Temp src Pulse Resp SpO2 Height Weight  10/07/22 1345 110/67 -- -- 84 -- -- -- --  10/07/22 1330 107/67 -- -- 93 -- -- -- --  10/07/22 1315 117/72 -- -- (!) 113 -- -- -- --  10/07/22 1300 111/77 -- -- (!) 104 -- -- -- --  10/07/22 1245 128/66 -- -- (!) 105 -- -- -- --  10/07/22 1125 123/77 -- -- 84 -- -- -- --  10/07/22 1103 137/85 98.7 F (37.1 C) Oral 88 20 99 % 5\' 10"  (1.778 m) 106.6 kg  10/07/22 1102 -- -- -- -- -- 100 % -- --     MDM - EKG Revealed Sinus rhythm with occasional premature ventricular complexes and a non specific T wave abnormality.  - Consulted Dr. Dione Plover on EKG results and discussed patient presentation and current clinical picture. Per MD add on a mag level to lab results, and send a referral to [facility].  - CBC normal, PreE labs normal BPs normotensive in MAU, low suspicion for postpartum PreE.  - Mag level normal  - Headache improved with PO Excedrin from 6-->2 with continuous improvement.  - plan for discharge.   Assessment and Plan   1. Postpartum headache   2. Postpartum care and examination   3. PVC (premature ventricular contraction)    -  Referral placed to OB cardio for Abnormal EKG in MAU.  - Patient has a few life stressors and reports that she is not resting and eating well. Encouraged to take time for self and increase  fluids and self care.  - Also discussed caffeine intake. Patient states she was a heavy caffeine drinker and has not had any in over 1 week. Discussed effects of caffeine on the brain. - Encouraged rest, relaxation and increased fluids. May also take PO Excedrin for headache in postpartum.  - Worsening signs and return precautions reviewed .  - Patient discharged home in stable condition and may return to MAU as needed.    Jacquiline Doe, MSN CNM  10/07/2022, 2:18 PM

## 2022-10-07 NOTE — MAU Note (Signed)
Mary Zavala is a 22 y.o. at Unknown here in MAU reporting: this past wk, having really bad migraines, still sitting behind her eyes.  Over weekend, started feeling really winded, no energy. Felt the worse today.  Feels like she has to manually breath, not happening automatically. No fever, cough or sore throat.  Vag del 12/18. Bleeding off and on. Baby is doing good, getting stronger every day.   Onset of complaint: past wk Pain score: HA 7 Vitals:   10/07/22 1102 10/07/22 1103  BP:  137/85  Pulse:  88  Resp:  20  Temp:  98.7 F (37.1 C)  SpO2: 100% 99%      Lab orders placed from triage:  none

## 2022-10-07 NOTE — MAU Note (Signed)
Notified Main Lab of Magnesium add-on.

## 2022-10-07 NOTE — Discharge Instructions (Signed)
Excedrin Tension Headache (purple box)

## 2022-10-13 ENCOUNTER — Other Ambulatory Visit: Payer: Self-pay | Admitting: Obstetrics & Gynecology

## 2022-10-14 ENCOUNTER — Other Ambulatory Visit: Payer: Self-pay

## 2022-10-16 ENCOUNTER — Other Ambulatory Visit: Payer: Self-pay | Admitting: Obstetrics & Gynecology

## 2022-10-16 ENCOUNTER — Other Ambulatory Visit: Payer: Self-pay

## 2022-10-19 ENCOUNTER — Other Ambulatory Visit: Payer: Self-pay

## 2022-10-20 ENCOUNTER — Ambulatory Visit: Payer: Medicaid Other | Admitting: Clinical

## 2022-10-20 DIAGNOSIS — Z91199 Patient's noncompliance with other medical treatment and regimen due to unspecified reason: Secondary | ICD-10-CM

## 2022-10-20 NOTE — BH Specialist Note (Signed)
Pt did not arrive to video visit and did not answer the phone; Left HIPPA-compliant message to call back Lonald Troiani from [facility] at  [phone] (Rj Pedrosa's office).  ?; left MyChart message for patient.  ? ?

## 2022-10-21 ENCOUNTER — Other Ambulatory Visit (HOSPITAL_BASED_OUTPATIENT_CLINIC_OR_DEPARTMENT_OTHER): Payer: Self-pay

## 2022-10-21 ENCOUNTER — Other Ambulatory Visit: Payer: Self-pay | Admitting: Obstetrics & Gynecology

## 2022-10-21 ENCOUNTER — Ambulatory Visit (INDEPENDENT_AMBULATORY_CARE_PROVIDER_SITE_OTHER): Payer: BC Managed Care – PPO | Admitting: Family Medicine

## 2022-10-21 ENCOUNTER — Encounter: Payer: Self-pay | Admitting: Family Medicine

## 2022-10-21 VITALS — BP 128/92 | HR 81 | Wt 231.4 lb

## 2022-10-21 DIAGNOSIS — O1490 Unspecified pre-eclampsia, unspecified trimester: Secondary | ICD-10-CM

## 2022-10-21 DIAGNOSIS — O1415 Severe pre-eclampsia, complicating the puerperium: Secondary | ICD-10-CM

## 2022-10-21 NOTE — Progress Notes (Signed)
[facility] Partum Visit Note  Mary Zavala is a 22 y.o. G1P1 female who presents for a postpartum visit. She is 6 weeks postpartum following a normal spontaneous vaginal delivery.  I have fully reviewed the prenatal and intrapartum course. The delivery was at unknown gestational weeks. Possibly 33w-34w, baby is being discharged from NICU today.  Anesthesia: IV sedation. Postpartum course has been uncomplicated. Baby is doing well- being discharged from NICU. Baby is feeding by  bottle fed formula, NICU High Calorie . Bleeding staining only. Bowel function is normal. Bladder function is normal. Patient is not sexually active. Contraception method is abstinence. Postpartum depression screening: negative.   The pregnancy intention screening data noted above was reviewed. Potential methods of contraception were discussed. The patient elected to proceed with No data recorded.   Edinburgh Postnatal Depression Scale - 10/21/22 1358       Edinburgh Postnatal Depression Scale:  In the Past 7 Days   I have been able to laugh and see the funny side of things. 0    I have looked forward with enjoyment to things. 1    I have blamed myself unnecessarily when things went wrong. 0    I have been anxious or worried for no good reason. 1    I have felt scared or panicky for no good reason. 1    Things have been getting on top of me. 1    I have been so unhappy that I have had difficulty sleeping. 1    I have felt sad or miserable. 1    I have been so unhappy that I have been crying. 0    The thought of harming myself has occurred to me. 0    Edinburgh Postnatal Depression Scale Total 6            Health Maintenance Due  Topic Date Due   PAP-Cervical Cytology Screening  Never done   PAP SMEAR-Modifier  Never done   INFLUENZA VACCINE  04/28/2022   COVID-19 Vaccine (2 - 2023-24 season) 05/29/2022    The following portions of the patient's history were reviewed and updated as appropriate:  allergies, current medications, past family history, past medical history, past social history, past surgical history, and problem list.  Review of Systems Pertinent items are noted in HPI.  Objective:  BP (!) 128/92   Pulse 81   Wt 231 lb 6.4 oz (105 kg)   Breastfeeding No   BMI 33.20 kg/m    General:  alert, cooperative, and appears stated age   Breasts:  not indicated  Lungs: clear to auscultation bilaterally  Heart:  regular rate and rhythm, S1, S2 normal, no murmur, click, rub or gallop  Abdomen: soft, non-tender; bowel sounds normal; no masses,  no organomegaly   Wound NA  GU exam:  not indicated       Assessment:   Normal postpartum exam.   Plan:   Essential components of care per ACOG recommendations:  1.  Mood and well being: Patient with negative depression screening today. Reviewed local resources for support.  - Patient tobacco use? No.   - hx of drug use? No.    2. Infant care and feeding:  -Patient currently breastmilk feeding? No.  -Social determinants of health (SDOH) reviewed in EPIC. No concerns  3. Sexuality, contraception and birth spacing - Patient does not want a pregnancy in the next year.  Desired family size is 3 children.  - Reviewed reproductive life planning. Reviewed  contraceptive methods based on pt preferences and effectiveness.  Patient desired IUD or IUS - desires to schedule for future appt with pap smear - Discussed birth spacing of 18 months  4. Sleep and fatigue -Encouraged family/partner/community support of 4 hrs of uninterrupted sleep to help with mood and fatigue  5. Physical Recovery  - Discussed patients delivery and complications. She describes her labor as mixed. - Patient had a Vaginal, no problems at delivery. Patient had a  none  laceration. Perineal healing reviewed. Patient expressed understanding - Patient has urinary incontinence? No. - Patient is safe to resume physical and sexual activity  6.  Health  Maintenance - HM due items addressed Yes - Last pap smear No results found for: "DIAGPAP" Pap smear not done at today's visit.  Patient desires to reschedule for pap -Breast Cancer screening indicated? No.   7. Chronic Disease/Pregnancy Condition follow up:  Preeclampsia, severe   #Preeclampsia with SF on Magnesium: Referral to OB cardio  #Preterm birth  - PCP follow up  Caren Macadam, [place] for [place], [place]

## 2022-10-21 NOTE — Patient Instructions (Signed)
To help dry up your breast milk you can try the following:  Sudafed 60mg daily for 5 days-- you can buy this at pharmacies. It is typically used as a decongestant dries mucous membranes and can help decrease milk supply  Clary Sage supplement- this is available at health stores or places like [employer]. https://kellymom.com/bf/can-i-breastfeed/herbs/herbs-oversupply/  Sage tea contains a natural form of estrogen and can decrease your supply and help dry up your milk. You can buy it at the health food store, or use the spice from your kitchen. Take 1 tsp of rubbed sage with 1 cup of hot water and let it steep for about 15 minutes. You will want to add some milk or honey to it as it is very bitter. One full cup every 6 hours usually will usually dry the milk up quickly. Sage works best if you use it along with cabbage leaf compresses. You can also get an alcohol tincture from a health food store. 3-4 ml every 6 hours usually dries up the milk quickly and goes down a little faster than the tea. The tincture is more readily absorbed in the mucous membranes, so it is somewhat more efficient at decreasing your milk supply.  Cabbage application to the breasts Cabbage leaf compresses are a home remedy that has been used for over a hundred years to reduce engorgement and dry up milk.  Here's how to use them: Buy plain green cabbage. Rinse and dry leaves. Put them in the refrigerator. Remove base of hard core vein and gently pound leaves. Wrap around breast and areola, leaving nipple exposed. The leaves fit nicely around the breast, and the cold feels good. Cover entire breast, and if needed, the area under your arms. Change every 30 minutes or sooner if the leaves become wilted.  Wear a supportive but not tight bra  Remove only enough milk for comfort with a hand pump or hand expression  Take 200 mg of vitamin B6 each day for 5 days to relieve engorgement.  

## 2022-10-22 ENCOUNTER — Other Ambulatory Visit (HOSPITAL_BASED_OUTPATIENT_CLINIC_OR_DEPARTMENT_OTHER): Payer: Self-pay

## 2022-10-22 ENCOUNTER — Other Ambulatory Visit: Payer: Self-pay

## 2022-10-24 ENCOUNTER — Other Ambulatory Visit (HOSPITAL_BASED_OUTPATIENT_CLINIC_OR_DEPARTMENT_OTHER): Payer: Self-pay

## 2022-10-24 MED ORDER — FUROSEMIDE 20 MG PO TABS: 20.0000 mg | ORAL_TABLET | Freq: Every day | ORAL | 0 refills | Status: DC

## 2022-10-24 MED ORDER — IBUPROFEN 600 MG PO TABS
600.0000 mg | ORAL_TABLET | Freq: Four times a day (QID) | ORAL | 0 refills | Status: DC | PRN
  Filled 2022-10-24: qty 60, 15d supply, fill #0

## 2022-10-26 ENCOUNTER — Other Ambulatory Visit: Payer: Self-pay

## 2022-10-31 ENCOUNTER — Encounter: Payer: Self-pay | Admitting: Family Medicine

## 2022-11-04 ENCOUNTER — Other Ambulatory Visit (HOSPITAL_BASED_OUTPATIENT_CLINIC_OR_DEPARTMENT_OTHER): Payer: Self-pay

## 2022-11-06 ENCOUNTER — Other Ambulatory Visit: Payer: Self-pay

## 2022-11-09 ENCOUNTER — Ambulatory Visit: Payer: BC Managed Care – PPO | Attending: Cardiology | Admitting: Cardiology

## 2022-11-18 ENCOUNTER — Ambulatory Visit: Payer: BC Managed Care – PPO | Admitting: Family Medicine

## 2022-11-18 NOTE — Progress Notes (Deleted)
    Contraception/Family Planning VISIT ENCOUNTER NOTE  Subjective:   Mary Zavala is a 22 y.o. G1P1 female here for reproductive life counseling.  Desires *** from [facility].  Reports she {DOES_DOES [id number] want a pregnancy in the next year. Denies abnormal vaginal bleeding, discharge, pelvic pain, problems with intercourse or other gynecologic concerns.    Gynecologic History No LMP recorded. Contraception: {method:5051}  Health Maintenance Due  Topic Date Due   PAP-Cervical Cytology Screening  Never done   PAP SMEAR-Modifier  Never done   INFLUENZA VACCINE  04/28/2022   COVID-19 Vaccine (2 - 2023-24 season) 05/29/2022     The following portions of the patient's history were reviewed and updated as appropriate: allergies, current medications, past family history, past medical history, past social history, past surgical history and problem list.  Review of Systems {ros; complete:30496}   Objective:  There were no vitals taken for this visit. Gen: well appearing, NAD HEENT: no scleral icterus CV: RR Lung: Normal WOB Ext: warm well perfused  PELVIC: Normal appearing external genitalia; normal appearing vaginal mucosa and cervix.  No abnormal discharge noted.  ***Pap smear obtained.  Normal uterine size, no other palpable masses, no uterine or adnexal tenderness.  IUD Insertion Procedure Note Patient identified, informed consent performed, consent signed.   Discussed risks of irregular bleeding, cramping, infection, malpositioning or misplacement of the IUD outside the uterus which may require further procedure such as laparoscopy. Time out was performed.  Urine pregnancy test negative.  Speculum placed in the vagina.  Cervix visualized.  Cleaned with Betadine x 2.  Grasped anteriorly with a single tooth tenaculum.  Uterus sounded to *** cm.  IUD placed per manufacturer's recommendations.  Strings trimmed to 3 cm. Tenaculum was removed, good hemostasis noted.  Patient tolerated  procedure well.   Patient was given post-procedure instructions.  She was advised to have backup contraception for one week.  Patient was also asked to check IUD strings periodically and follow up in 4 weeks for IUD check.   Assessment and Plan:   Contraception counseling: Reviewed all forms of birth control options available including abstinence; over the counter/barrier methods; hormonal contraceptive medication including pill, patch, ring, injection,contraceptive implant; hormonal and nonhormonal IUDs; permanent sterilization options including vasectomy and the various tubal sterilization modalities. Risks and benefits reviewed.  Questions were answered.  Written information was also given to the patient to review.  Patient desires ***, this was prescribed for patient. She will follow up in  *** for surveillance.  She was told to call with any further questions, or with any concerns about this method of contraception.  Emphasized use of condoms 100% of the time for STI prevention.  There are no diagnoses linked to this encounter.   Please refer to After Visit Summary for other counseling recommendations.   No follow-ups on file.  Caren Macadam, MD, MPH, ABFM Attending [place] for [facility]

## 2022-11-19 ENCOUNTER — Encounter: Payer: Self-pay | Admitting: *Deleted

## 2022-11-22 ENCOUNTER — Emergency Department (HOSPITAL_COMMUNITY): Payer: BC Managed Care – PPO

## 2022-11-22 ENCOUNTER — Other Ambulatory Visit: Payer: Self-pay

## 2022-11-22 ENCOUNTER — Encounter (HOSPITAL_COMMUNITY): Payer: Self-pay

## 2022-11-22 ENCOUNTER — Emergency Department (HOSPITAL_COMMUNITY)
Admission: EM | Admit: 2022-11-22 | Discharge: 2022-11-22 | Disposition: A | Discharge status: Home / Self Care | Payer: BC Managed Care – PPO | Attending: Emergency Medicine | Admitting: Emergency Medicine

## 2022-11-22 DIAGNOSIS — J189 Pneumonia, unspecified organism: Secondary | ICD-10-CM | POA: Diagnosis not present

## 2022-11-22 DIAGNOSIS — I10 Essential (primary) hypertension: Secondary | ICD-10-CM | POA: Diagnosis not present

## 2022-11-22 DIAGNOSIS — J039 Acute tonsillitis, unspecified: Secondary | ICD-10-CM | POA: Diagnosis not present

## 2022-11-22 DIAGNOSIS — Z1152 Encounter for screening for COVID-19: Secondary | ICD-10-CM | POA: Diagnosis not present

## 2022-11-22 DIAGNOSIS — R059 Cough, unspecified: Secondary | ICD-10-CM | POA: Diagnosis present

## 2022-11-22 DIAGNOSIS — Z79899 Other long term (current) drug therapy: Secondary | ICD-10-CM | POA: Insufficient documentation

## 2022-11-22 LAB — CBC
HCT: 35.2 % — ABNORMAL LOW (ref 36.0–46.0)
Hemoglobin: 11.8 g/dL — ABNORMAL LOW (ref 12.0–15.0)
MCH: 28 pg (ref 26.0–34.0)
MCHC: 33.5 g/dL (ref 30.0–36.0)
MCV: 83.6 fL (ref 80.0–100.0)
Platelets: 268 10*3/uL (ref 150–400)
RBC: 4.21 MIL/uL (ref 3.87–5.11)
RDW: 14 % (ref 11.5–15.5)
WBC: 12.8 10*3/uL — ABNORMAL HIGH (ref 4.0–10.5)
nRBC: 0 % (ref 0.0–0.2)

## 2022-11-22 LAB — BASIC METABOLIC PANEL
Anion gap: 10 (ref 5–15)
BUN: 5 mg/dL — ABNORMAL LOW (ref 6–20)
CO2: 25 mmol/L (ref 22–32)
Calcium: 8.9 mg/dL (ref 8.9–10.3)
Chloride: 101 mmol/L (ref 98–111)
Creatinine, Ser: 0.74 mg/dL (ref 0.44–1.00)
GFR, Estimated: >60 mL/min (ref >60)
Glucose, Bld: 90 mg/dL (ref 70–99)
Potassium: 3.4 mmol/L — ABNORMAL LOW (ref 3.5–5.1)
Sodium: 136 mmol/L (ref 135–145)

## 2022-11-22 LAB — RESP PANEL BY RT-PCR (RSV, FLU A&B, COVID)  RVPGX2
Influenza A by PCR: NEGATIVE
Influenza B by PCR: NEGATIVE
Resp Syncytial Virus by PCR: NEGATIVE
SARS Coronavirus 2 by RT PCR: NEGATIVE

## 2022-11-22 LAB — I-STAT BETA HCG BLOOD, ED (MC, WL, AP ONLY): I-stat hCG, quantitative: <5 m[IU]/mL (ref <5)

## 2022-11-22 LAB — GROUP A STREP BY PCR: Group A Strep by PCR: NOT DETECTED

## 2022-11-22 MED ORDER — AMOXICILLIN-POT CLAVULANATE 875-125 MG PO TABS: 1.0000 | ORAL_TABLET | Freq: Two times a day (BID) | ORAL | 0 refills | Status: AC

## 2022-11-22 MED ORDER — ACETAMINOPHEN 160 MG/5ML PO SOLN
650.0000 mg | Freq: Once | ORAL | Status: AC
  Administered 2022-11-22: 650 mg via ORAL
  Filled 2022-11-22: qty 20.3

## 2022-11-22 MED ORDER — SODIUM CHLORIDE 0.9 % IV BOLUS
1000.0000 mL | Freq: Once | INTRAVENOUS | Status: AC
  Administered 2022-11-22: 1000 mL via INTRAVENOUS

## 2022-11-22 MED ORDER — AMOXICILLIN-POT CLAVULANATE 875-125 MG PO TABS
1.0000 | ORAL_TABLET | Freq: Once | ORAL | Status: AC
  Administered 2022-11-22: 1 via ORAL
  Filled 2022-11-22: qty 1

## 2022-11-22 MED ORDER — DEXAMETHASONE SODIUM PHOSPHATE 10 MG/ML IJ SOLN
10.0000 mg | Freq: Once | INTRAMUSCULAR | Status: AC
  Administered 2022-11-22: 10 mg via INTRAVENOUS
  Filled 2022-11-22: qty 1

## 2022-11-22 NOTE — ED Notes (Signed)
Pt transported to xray 

## 2022-11-22 NOTE — ED Provider Notes (Signed)
[facility] Provider Note   CSN: [id number] Arrival date & time: 11/22/22  1713     History  Chief Complaint  Patient presents with   flu like symptoms    Mary Zavala is a 22 y.o. female. With past medical history of hypertension, obesity, pre-diabetes who presents to the emergency department with sore throat  States symptoms began about 4 days ago.  Patient states that she had donated plasma 4 days ago.  She states that after she came home she began having chills.  She did not take her temperature and just thought that it was cold outside and she had just donated which was likely the reason for her symptoms.  She states that then the next day she woke up profusely diaphoretic and having sore throat and productive cough.  She states that then yesterday she thought her symptoms were improving but then today they progressively worsened.  She has not been able to really eat or drink over the past 4 days.  She is having difficulty swallowing and has been trying to rest her voice because it hurts her throat.  She has been spitting up her sputum because it feels difficult to swallow and she feels like she is choking on it.  She has been coughing but thinks that this is also from her sputum.  She feels mildly short of breath, but again, she is unsure if it is because she is having some difficulties with swallowing.  She denies having chest pain or palpitations.  She denies sick contacts.  HPI     Home Medications Prior to Admission medications   Medication Sig Start Date End Date Taking? Authorizing Provider  amoxicillin-clavulanate (AUGMENTIN) 875-125 MG tablet Take 1 tablet by mouth every 12 (twelve) hours for 10 days. 11/22/22 12/02/22 Yes Mary Hillier, Mary Zavala  cyclobenzaprine (FLEXERIL) 10 MG tablet Take 1 tablet (10 mg total) by mouth 3 (three) times daily as needed for muscle spasms. 09/16/22   Anyanwu, Sallyanne Havers, MD  ferrous sulfate 325 (65  FE) MG tablet Take 1 tablet (325 mg total) by mouth every other day. 09/17/22   Anyanwu, Sallyanne Havers, MD  furosemide (LASIX) 20 MG tablet Take 1 tablet (20 mg total) by mouth daily for 3 days. 10/24/22 10/27/22  Anyanwu, Sallyanne Havers, MD  ibuprofen (ADVIL) 600 MG tablet Take 1 tablet (600 mg total) by mouth every 6 (six) hours as needed for mild pain, moderate pain, cramping, headache or fever. 10/24/22   Anyanwu, Sallyanne Havers, MD  NIFEdipine (ADALAT CC) 30 MG 24 hr tablet Take 1 tablet (30 mg total) by mouth daily. 09/16/22   Anyanwu, Sallyanne Havers, MD  Prenatal Vit-Fe Fumarate-FA (PRENATAL MULTIVITAMIN) TABS tablet Take 1 tablet by mouth daily at 12 noon. 09/16/22   Anyanwu, Sallyanne Havers, MD  senna-docusate (SENOKOT-S) 8.6-50 MG tablet Take 2 tablets by mouth daily. Patient not taking: Reported on 10/21/2022 09/16/22   Osborne Oman, MD      Allergies    Patient has no known allergies.    Review of Systems   Review of Systems  HENT:  Positive for drooling, sore throat and trouble swallowing.   All other systems reviewed and are negative.   Physical Exam Updated Vital Signs BP 123/71   Pulse 94   Temp 99.6 F (37.6 C) (Oral)   Resp (!) 23   Ht '5\' 10"'$  (1.778 m)   Wt 105 kg   SpO2 100%  BMI 33.21 kg/m  Physical Exam Vitals and nursing note reviewed.  Constitutional:      General: She is not in acute distress.    Appearance: Normal appearance. She is ill-appearing.  HENT:     Head: Normocephalic.     Comments: Does not have true trismus but is having some difficulty opening her mouth secondary to pain    Nose: Nose normal.     Mouth/Throat:     Mouth: Mucous membranes are dry.     Pharynx: Uvula midline. Posterior oropharyngeal erythema and uvula swelling present.     Tonsils: Tonsillar exudate present. No tonsillar abscesses. 3+ on the right. 3+ on the left.     Comments: No sublingual swelling.  There is no submental swelling.  No stridor or wheezing. Eyes:     General: No scleral  icterus.    Extraocular Movements: Extraocular movements intact.  Cardiovascular:     Rate and Rhythm: Regular rhythm. Tachycardia present.     Pulses: Normal pulses.     Heart sounds: No murmur heard. Pulmonary:     Effort: Pulmonary effort is normal. No respiratory distress.     Breath sounds: Normal breath sounds. No stridor. No wheezing, rhonchi or rales.  Abdominal:     General: Bowel sounds are normal.     Palpations: Abdomen is soft.  Musculoskeletal:     Cervical back: Neck supple.  Skin:    General: Skin is warm and dry.     Capillary Refill: Capillary refill takes less than 2 seconds.  Neurological:     General: No focal deficit present.     Mental Status: She is alert and oriented to person, place, and time. Mental status is at baseline.  Psychiatric:        Mood and Affect: Mood normal.        Behavior: Behavior normal.        Thought Content: Thought content normal.        Judgment: Judgment normal.     ED Results / Procedures / Treatments   Labs (all labs ordered are listed, but only abnormal results are displayed) Labs Reviewed  BASIC METABOLIC PANEL - Abnormal; Notable for the following components:      Result Value   Potassium 3.4 (*)    BUN 5 (*)    All other components within normal limits  CBC - Abnormal; Notable for the following components:   WBC 12.8 (*)    Hemoglobin 11.8 (*)    HCT 35.2 (*)    All other components within normal limits  GROUP A STREP BY PCR  RESP PANEL BY RT-PCR (RSV, FLU A&B, COVID)  RVPGX2  I-STAT BETA HCG BLOOD, ED ([facility], [facility], [facility] ONLY)    EKG EKG Interpretation  Date/Time:  Sunday November 22 2022 17:32:38 EST Ventricular Rate:  102 PR Interval:  116 QRS Duration: 90 QT Interval:  349 QTC Calculation: 455 R Axis:   86 Text Interpretation: Sinus tachycardia Ventricular premature complex Borderline T abnormalities, diffuse leads Baseline wander in lead(s) III aVL when compared top rior, more wandering baseline. No STEMI  Confirmed by Antony Blackbird [phone]) on 11/22/2022 6:46:30 PM  Radiology DG Chest 2 View  Result Date: 11/22/2022 CLINICAL DATA:  Shortness of breath. EXAM: CHEST - 2 VIEW COMPARISON:  07/31/2015 FINDINGS: Faint patchy right suprahilar opacity. The heart is normal in size with normal mediastinal contours. Normal pulmonary vasculature. No pleural effusion or pneumothorax. No acute osseous findings. IMPRESSION: Faint patchy right suprahilar  opacity may be atelectasis or pneumonia. Electronically Signed   By: Keith Rake M.D.   On: 11/22/2022 18:48    Procedures Procedures   Medications Ordered in ED Medications  amoxicillin-clavulanate (AUGMENTIN) 875-125 MG per tablet 1 tablet (has no administration in time range)  sodium chloride 0.9 % bolus 1,000 mL (0 mLs Intravenous Stopped 11/22/22 1959)  dexamethasone (DECADRON) injection 10 mg (10 mg Intravenous Given 11/22/22 1821)  acetaminophen (TYLENOL) 160 MG/5ML solution 650 mg (650 mg Oral Given 11/22/22 1830)    ED Course/ Medical Decision Making/ A&P                    PORT Score: 13        Medical Decision Making Amount and/or Complexity of Data Reviewed Labs: ordered. Radiology: ordered.  Risk OTC drugs. Prescription drug management.  Initial Impression and Ddx 22 year old female who presents with cough Patient PMH that increases complexity of ED encounter:  hypertension, obesity, pre-diabetes Differential:   Interpretation of Diagnostics I independent reviewed and interpreted the labs as followed: COVID, flu, RSV, strep negative.  WBCs of 12.8  - I independently visualized the following imaging with scope of interpretation limited to determining acute life threatening conditions related to emergency care: Chest x-ray, which revealed likely pneumonia of the right suprahilar region  Patient Reassessment and Ultimate Disposition/Management 22 year old female who presents to the emergency department with sore throat,  difficulty swallowing.  On exam she is tachycardic, has a low-grade fever.  She is spitting up her saliva into a cup on my exam.  She has significant bilateral tonsillar swelling with exudates present.  Her uvula is also mildly swollen.  There is no stridor or wheezing on exam.  She is oxygenating 100% on room air.  She has had decreased p.o. intake, to give her a liter of IV fluids as well as IV Decadron.  Will test her for viral illness and strep throat  On reassessment she feels that her sore throat and ability to swallow has improved since receiving steroids.  She does have a right suprahilar pneumonia.  Consistent with community-acquired pneumonia.  She does not have severe disease.  Port score is 13.  She is oxygenating 100% on room air there is no tachypnea, adventitious lung sounds or stridor.  Heart rate is slightly improved with IV fluids.  She received Tylenol for her fever which I think is also driving her heart rate.  She does likely have a viral illness which caused her to have a superimposed pneumonia.  She also has significant tonsillar swelling bilaterally with exudates.  Her strep test was negative.  The Decadron has helped her symptoms.  There is no evidence of a PTA or RPA or Ludwig's angina on her exam.  I do not think that she has a deep space infection requiring advanced imaging.  I am going to cover her for the tonsillitis and pneumonia with Augmentin for 10 days.  Will go with the longer course for tonsillitis which will adequately cover the pneumonia course.  I would like her to have follow-up with her primary care in 3 days which she verbalized understanding.  Will have her have strict return precautions for worsening difficulty breathing or shortness of breath or inability to swallow liquids.  She was able to tolerate liquid p.o. trial here in the emergency department.  The patient has been appropriately medically screened and/or stabilized in the ED. I have low suspicion for any  other emergent medical condition which  would require further screening, evaluation or treatment in the ED or require inpatient management. At time of discharge the patient is hemodynamically stable and in no acute distress. I have discussed work-up results and diagnosis with patient and answered all questions. Patient is agreeable with discharge plan. We discussed strict return precautions for returning to the emergency department and they verbalized understanding.      Patient management required discussion with the following services or consulting groups:  None  Complexity of Problems Addressed Acute complicated illness or Injury  Additional Data Reviewed and Analyzed Further history obtained from: Past medical history and medications listed in the EMR, Recent PCP notes, and Care Everywhere  Patient Encounter Risk Assessment Prescriptions and Consideration of hospitalization  Final Clinical Impression(s) / ED Diagnoses Final diagnoses:  Pneumonia of right lung due to infectious organism, unspecified part of lung  Tonsillitis    Rx / DC Orders ED Discharge Orders          Ordered    amoxicillin-clavulanate (AUGMENTIN) 875-125 MG tablet  Every 12 hours        11/22/22 2042              Mary Hillier, Mary Zavala 11/22/22 2048    Tegeler, Gwenyth Allegra, MD 11/22/22 2332

## 2022-11-22 NOTE — Discharge Instructions (Addendum)
You were seen in the emergency department today for cough and sore throat.  You do have a small pneumonia in your right lung and rather significant swelling to your tonsils.  I am placing you on an antibiotic called Augmentin that you will take twice a day over the next 10 days to treat the pneumonia and your tonsillitis.  I would like you to follow-up with your primary care provider in the next 3 days to have a symptom recheck.  Please return to the emergency department for any worsening symptoms like more difficulty breathing or shortness of breath, inability to swallow liquids.

## 2022-11-22 NOTE — ED Triage Notes (Signed)
Pt c/o SOB, sore throat, dysphagia, productive cough w/green mucous and unable to eat or drink due to dysphagiax4d.

## 2022-11-26 ENCOUNTER — Other Ambulatory Visit: Payer: Self-pay

## 2022-11-26 ENCOUNTER — Emergency Department (HOSPITAL_COMMUNITY): Payer: BC Managed Care – PPO

## 2022-11-26 ENCOUNTER — Encounter (HOSPITAL_COMMUNITY): Payer: Self-pay

## 2022-11-26 ENCOUNTER — Emergency Department (HOSPITAL_COMMUNITY)
Admission: EM | Admit: 2022-11-26 | Discharge: 2022-11-26 | Disposition: A | Discharge status: Home / Self Care | Payer: BC Managed Care – PPO | Attending: Emergency Medicine | Admitting: Emergency Medicine

## 2022-11-26 DIAGNOSIS — J36 Peritonsillar abscess: Secondary | ICD-10-CM

## 2022-11-26 DIAGNOSIS — R1310 Dysphagia, unspecified: Secondary | ICD-10-CM | POA: Diagnosis present

## 2022-11-26 LAB — CBC
HCT: 35.7 % — ABNORMAL LOW (ref 36.0–46.0)
Hemoglobin: 11.7 g/dL — ABNORMAL LOW (ref 12.0–15.0)
MCH: 27.7 pg (ref 26.0–34.0)
MCHC: 32.8 g/dL (ref 30.0–36.0)
MCV: 84.6 fL (ref 80.0–100.0)
Platelets: 376 10*3/uL (ref 150–400)
RBC: 4.22 MIL/uL (ref 3.87–5.11)
RDW: 14.4 % (ref 11.5–15.5)
WBC: 13.3 10*3/uL — ABNORMAL HIGH (ref 4.0–10.5)
nRBC: 0 % (ref 0.0–0.2)

## 2022-11-26 LAB — BASIC METABOLIC PANEL
Anion gap: 9 (ref 5–15)
BUN: <5 mg/dL — ABNORMAL LOW (ref 6–20)
CO2: 25 mmol/L (ref 22–32)
Calcium: 8.9 mg/dL (ref 8.9–10.3)
Chloride: 103 mmol/L (ref 98–111)
Creatinine, Ser: 0.7 mg/dL (ref 0.44–1.00)
GFR, Estimated: >60 mL/min (ref >60)
Glucose, Bld: 98 mg/dL (ref 70–99)
Potassium: 3.7 mmol/L (ref 3.5–5.1)
Sodium: 137 mmol/L (ref 135–145)

## 2022-11-26 MED ORDER — KETOROLAC TROMETHAMINE 30 MG/ML IJ SOLN
15.0000 mg | Freq: Once | INTRAMUSCULAR | Status: AC
  Administered 2022-11-26: 15 mg via INTRAVENOUS
  Filled 2022-11-26: qty 1

## 2022-11-26 MED ORDER — CLINDAMYCIN PHOSPHATE 900 MG/50ML IV SOLN
900.0000 mg | Freq: Once | INTRAVENOUS | Status: AC
  Administered 2022-11-26: 900 mg via INTRAVENOUS
  Filled 2022-11-26: qty 50

## 2022-11-26 MED ORDER — CLINDAMYCIN HCL 150 MG PO CAPS: 300.0000 mg | ORAL_CAPSULE | Freq: Three times a day (TID) | ORAL | 0 refills | Status: AC

## 2022-11-26 MED ORDER — DEXAMETHASONE SODIUM PHOSPHATE 10 MG/ML IJ SOLN
10.0000 mg | Freq: Once | INTRAMUSCULAR | Status: AC
  Administered 2022-11-26: 10 mg via INTRAVENOUS
  Filled 2022-11-26: qty 1

## 2022-11-26 MED ORDER — IOHEXOL 350 MG/ML SOLN
75.0000 mL | Freq: Once | INTRAVENOUS | Status: AC | PRN
  Administered 2022-11-26: 75 mL via INTRAVENOUS

## 2022-11-26 NOTE — ED Notes (Signed)
Pt updated that MD would like to wait before she eats/drinks anything.

## 2022-11-26 NOTE — ED Triage Notes (Signed)
Pt c/o sore throat, dysphagia and right ear pain and ringing. Pt states was seen prior for same, but states it's getting worse and the pain is moving into her jaw. Pt able to maintain secretions. Pt is eupneic.

## 2022-11-26 NOTE — Discharge Instructions (Addendum)
As discussed, your sore throat and facial swelling are likely due to a small abscess that is forming on the right side of your throat.  Please take your antibiotics as prescribed, use Tylenol and ibuprofen for additional relief and monitor your condition carefully.  Follow-up with our ENT specialist as needed.

## 2022-11-26 NOTE — ED Provider Triage Note (Signed)
Emergency Medicine Provider Triage Evaluation Note  Andres Shad , a 22 y.o. female  was evaluated in triage.  Pt complains of sore throat. Was just seen for same and diagnosed with pneumonia. Started on Augmentin which she has been taking.  Negative COVID, flu, RSV, and strep swabs at that time.  States that her pain is only getting worse.  That her cough and congestion has improved.  Pain is worse on the right side of her throat.  She is only able to open her mouth a few fingerbreadths.  She does endorse recent oral intercourse.  No known sick contacts.  Denies fevers or chills.  She is able to tolerate secretions.  Endorses voice changes. Denies rashes.  Review of Systems  Positive:  Negative:   Physical Exam  BP (!) 142/89 (BP Location: Right Arm)   Pulse 77   Temp 98.7 F (37.1 C) (Oral)   Resp 17   Ht '5\' 10"'$  (1.778 m)   Wt 105 kg   SpO2 99%   BMI 33.21 kg/m  Gen:   Awake, no distress   Resp:  Normal effort  MSK:   Moves extremities without difficulty  Other:  Unable to visualize the oropharynx given patient only able to open her mouth 2 fingerbreadths.   Medical Decision Making  Medically screening exam initiated at 10:41 AM.  Appropriate orders placed.  Jordanne A Scimone was informed that the remainder of the evaluation will be completed by another provider, this initial triage assessment does not replace that evaluation, and the importance of remaining in the ED until their evaluation is complete.     Bud Face, PA-C 11/26/22 1045

## 2022-11-26 NOTE — ED Provider Notes (Signed)
[facility] Provider Note   CSN: [id number] Arrival date & time: 11/26/22  [id number]     History  Chief Complaint  Patient presents with   Sore Throat    Mary Zavala is a 22 y.o. female.  HPI Patient presents with 1 week of right facial pain, fullness, difficulty with swallowing and speaking.  She was seen, evaluated, started on Augmentin, continues to have symptoms in spite of that. She states that she is generally well, had a child 2 months ago, he is healthy.    Home Medications Prior to Admission medications   Medication Sig Start Date End Date Taking? Authorizing Provider  amoxicillin-clavulanate (AUGMENTIN) 875-125 MG tablet Take 1 tablet by mouth every 12 (twelve) hours for 10 days. 11/22/22 12/02/22  Mickie Hillier, PA-C  cyclobenzaprine (FLEXERIL) 10 MG tablet Take 1 tablet (10 mg total) by mouth 3 (three) times daily as needed for muscle spasms. 09/16/22   Anyanwu, Sallyanne Havers, MD  ferrous sulfate 325 (65 FE) MG tablet Take 1 tablet (325 mg total) by mouth every other day. 09/17/22   Anyanwu, Sallyanne Havers, MD  furosemide (LASIX) 20 MG tablet Take 1 tablet (20 mg total) by mouth daily for 3 days. 10/24/22 10/27/22  Anyanwu, Sallyanne Havers, MD  ibuprofen (ADVIL) 600 MG tablet Take 1 tablet (600 mg total) by mouth every 6 (six) hours as needed for mild pain, moderate pain, cramping, headache or fever. 10/24/22   Anyanwu, Sallyanne Havers, MD  NIFEdipine (ADALAT CC) 30 MG 24 hr tablet Take 1 tablet (30 mg total) by mouth daily. 09/16/22   Anyanwu, Sallyanne Havers, MD  Prenatal Vit-Fe Fumarate-FA (PRENATAL MULTIVITAMIN) TABS tablet Take 1 tablet by mouth daily at 12 noon. 09/16/22   Anyanwu, Sallyanne Havers, MD  senna-docusate (SENOKOT-S) 8.6-50 MG tablet Take 2 tablets by mouth daily. Patient not taking: Reported on 10/21/2022 09/16/22   Osborne Oman, MD      Allergies    Patient has no known allergies.    Review of Systems   Review of Systems  All other  systems reviewed and are negative.   Physical Exam Updated Vital Signs BP (!) 142/89 (BP Location: Right Arm)   Pulse 77   Temp 98.7 F (37.1 C) (Oral)   Resp 17   Ht '5\' 10"'$  (1.778 m)   Wt 105 kg   SpO2 99%   BMI 33.21 kg/m  Physical Exam Vitals and nursing note reviewed.  Constitutional:      General: She is not in acute distress.    Appearance: She is well-developed.  HENT:     Head: Normocephalic and atraumatic.   Eyes:     Conjunctiva/sclera: Conjunctivae normal.  Pulmonary:     Effort: Pulmonary effort is normal. No respiratory distress.     Breath sounds: Normal breath sounds. No stridor.  Abdominal:     General: There is no distension.  Skin:    General: Skin is warm and dry.  Neurological:     Mental Status: She is alert and oriented to person, place, and time.     Cranial Nerves: No cranial nerve deficit.  Psychiatric:        Mood and Affect: Mood normal.     ED Results / Procedures / Treatments   Labs (all labs ordered are listed, but only abnormal results are displayed) Labs Reviewed  CBC - Abnormal; Notable for the following components:      Result Value  WBC 13.3 (*)    Hemoglobin 11.7 (*)    HCT 35.7 (*)    All other components within normal limits  BASIC METABOLIC PANEL - Abnormal; Notable for the following components:   BUN <5 (*)    All other components within normal limits  MONONUCLEOSIS SCREEN  GC/CHLAMYDIA PROBE AMP ([place]) NOT AT [facility]    EKG None  Radiology CT Soft Tissue Neck W Contrast  Addendum Date: 11/26/2022   ADDENDUM REPORT: 11/26/2022 14:22 ADDENDUM: Study discussed by telephone with PA Harvey on 11/26/2022 at 1420 hours. Electronically Signed   By: Genevie Ann M.D.   On: 11/26/2022 14:22   Result Date: 11/26/2022 CLINICAL DATA:  22 year old female with sore throat, dysphagia, right ear pain and tinnitus. Progressive symptoms, now difficulty opening mouth. EXAM: CT NECK WITH CONTRAST TECHNIQUE: Multidetector CT  imaging of the neck was performed using the standard protocol following the bolus administration of intravenous contrast. RADIATION DOSE REDUCTION: This exam was performed according to the departmental dose-optimization program which includes automated exposure control, adjustment of the mA and/or kV according to patient size and/or use of iterative reconstruction technique. CONTRAST:  41m OMNIPAQUE IOHEXOL 350 MG/ML SOLN COMPARISON:  None Available. FINDINGS: Pharynx and larynx: Larynx and epiglottis remain normal. Hypopharynx is distended with gas. Asymmetric palatine tonsil enlargement. Superimposed generalized adenoid enlargement. Some associated adenoid and tonsillar hyperenhancement. Lingual tonsil least affected. Asymmetrically larger right palatine tonsil containing an oval mildly rim enhancing and slightly irregular hypodense collection which is 26 by 19 x 34 mm (AP by transverse by CC). Associated inflammation in the right parapharyngeal space. Trace if any retropharyngeal space effusion. Contralateral left parapharyngeal space remains normal. Salivary glands: Negative sublingual space, sublingual glands. Submandibular glands appear symmetric and within normal limits aside from some ductal low ectasias a at the hilum. Parotid glands are within normal limits. Thyroid: Negative. Lymph nodes: Enlarged, reactive right level 2A lymph nodes individually up to 19 mm short axis. Possible some edema in the right digastric muscle, although the right masticator space is otherwise within normal limits. Less pronounced reactive lymph nodes at other nodal stations, including bilateral retropharyngeal nodes. No cystic or necrotic nodes. Vascular: Major vascular structures in the neck and at the skull base remain patent including the right IJ. Limited intracranial: Negative. Visualized orbits: Negative. Mastoids and visualized paranasal sinuses: Bilateral maxillary sinus mucosal thickening and mucous retention cysts.  Right greater than left sphenoid sinus involvement. Frontal and ethmoid sinuses relatively spared. Bilateral tympanic cavities and mastoids remain clear. Skeleton: No acute dental finding identified. Oral tongue piercing. No acute osseous abnormality identified. Upper chest: Vague nearly 3 cm area of inflammatory peribronchial opacity in the right upper lung, posterior right upper lobe bordering the major fissure. Visible trachea and other major airways are patent. Contralateral left upper lung is clear. No pleural effusion. Visible superior mediastinum remains normal. IMPRESSION: 1. Positive for 3 cm Right Tonsillar Abscess. Underlying Acute Tonsillitis. Associated inflammation in the right parapharyngeal space. Reactive cervical lymph nodes, maximal at the right level 2A station. No suppurative nodes. 2. Superimposed Right upper lobe Bronchopneumonia vs developing Pneumonia. No pleural effusion. Normal superior mediastinum. 3. Paranasal sinus inflammation without complicating features. Electronically Signed: By: HGenevie AnnM.D. On: 11/26/2022 13:46    Procedures Procedures    Medications Ordered in ED Medications  clindamycin (CLEOCIN) IVPB 900 mg (900 mg Intravenous New Bag/Given 11/26/22 1543)  ketorolac (TORADOL) 30 MG/ML injection 15 mg (15 mg Intravenous Given 11/26/22 1253)  dexamethasone (  DECADRON) injection 10 mg (10 mg Intravenous Given 11/26/22 1254)  iohexol (OMNIPAQUE) 350 MG/ML injection 75 mL (75 mLs Intravenous Contrast Given 11/26/22 1331)  dexamethasone (DECADRON) injection 10 mg (10 mg Intravenous Given 11/26/22 1537)    ED Course/ Medical Decision Making/ A&P                             Medical Decision Making Generally well adult female presents with right-sided facial pain, in the context of recent initiation of antibiotics, now with decreased ability to open her mouth.  Concern for pharyngitis versus deep space infection, bacteremia, sepsis.  Amount and/or Complexity of Data  Reviewed External Data Reviewed: notes. Labs: ordered. Decision-making details documented in ED Course. Radiology: independent interpretation performed. Decision-making details documented in ED Course. Discussion of management or test interpretation with external provider(s): Cussed patient's case with our ENT colleague, Dr. Benjamine Mola.  With concern for tonsillar abscess patient will have 20 mg total of Decadron, Toradol, started on IV clindamycin, be discharged with clindamycin to follow-up with him in the clinic.  No evidence of bacteremia, sepsis, CT otherwise reassuring, labs reassuring, consistent with infection.  Risk OTC drugs. Prescription drug management. Decision regarding hospitalization.           Final Clinical Impression(s) / ED Diagnoses Final diagnoses:  Tonsillar abscess    Rx / DC Orders ED Discharge Orders          Ordered    clindamycin (CLEOCIN) 150 MG capsule  3 times daily        11/26/22 1603              Carmin Muskrat, MD 11/26/22 [phone]

## 2023-02-09 ENCOUNTER — Ambulatory Visit (INDEPENDENT_AMBULATORY_CARE_PROVIDER_SITE_OTHER): Payer: Self-pay | Admitting: *Deleted

## 2023-02-09 ENCOUNTER — Telehealth: Payer: Self-pay | Admitting: Family Medicine

## 2023-02-09 NOTE — Telephone Encounter (Signed)
  Chief Complaint: unprotected intercourse- vaginal irritation  Symptoms: vaginal irritation, feels like "swelling in vagina" Frequency: IC- 4 days ago- patient is worried about pregnancy but past 72 hour mark for morning after pill  Pertinent Negatives: Patient denies discharge, bumps Disposition: [] ED /[] Urgent Care (no appt availability in office) / [] Appointment(In office/virtual)/ []  [facility]/ [] Home Care/ [] Refused Recommended Disposition /[] [facility]/ [x]  Follow-up with PCP Additional Notes: Call to office- they advised no open appointment- can come for nurse visit- or call gyn provider. Patient to call GYN provider- will call back if she feels she needs the nurse visit  Reason for Disposition  Patient is worried they have a sexually transmitted infection (STI)  Answer Assessment - Initial Assessment Questions 1. SYMPTOM: "What's the main symptom you're concerned about?" (e.g., pain, itching, dryness)     Vaginal irritation- "feels  like swelling"   PP 09/14/22 2. LOCATION: "Where is the  irritation located?" (e.g., inside/outside, left/right)     Outside of vaginal 3. ONSET: "When did the  irritation  start?"     2 days 4. PAIN: "Is there any pain?" If Yes, ask: "How bad is it?" (Scale: 1-10; mild, moderate, severe)   -  MILD (1-3): Doesn't interfere with normal activities.    -  MODERATE (4-7): Interferes with normal activities (e.g., work or school) or awakens from sleep.     -  SEVERE (8-10): Excruciating pain, unable to do any normal activities.     no 5. ITCHING: "Is there any itching?" If Yes, ask: "How bad is it?" (Scale: 1-10; mild, moderate, severe)     no 6. CAUSE: "What do you think is causing the discharge?" "Have you had the same problem before? What happened then?"     Unprotected intercourse  7. OTHER SYMPTOMS: "Do you have any other symptoms?" (e.g., fever, itching, vaginal bleeding, pain with urination, injury to genital area, vaginal  foreign body)     Unprotected intercourse  Protocols used: Vaginal Symptoms-A-AH

## 2023-02-09 NOTE — Telephone Encounter (Signed)
FYI

## 2023-02-09 NOTE — Telephone Encounter (Signed)
Summary: Unprotected intercourse/not feeling well   Patient states that she had unprotected intercourse last week for the first time since having a baby in December. Patient states that she has not been good. She states that her insides and vagina do not feel good. Next available appointment with pcp is 5-28. Please advise.

## 2023-02-09 NOTE — Telephone Encounter (Signed)
Patient states vaginal areas is swollen and irritated, would like a call back

## 2023-02-09 NOTE — Telephone Encounter (Signed)
Called patient at number listed in chart--[phone]--and verified patient with name and DOB.   Per patient, vaginal area is swollen and irritated--stating that it is slightly tender when wiping. Patient denies vaginal itching, redness, or pain. No bleeding or discharge.  Patient states that she first noticed these symptoms after having intercourse 4-5 days ago--has not gone away since then.   Told patient we would consult with providers and give her a call back.   Maureen Ralphs RN on 02/09/23 at 1212

## 2023-02-12 NOTE — Telephone Encounter (Signed)
I called Rekisha back and she reports she was able [facility] and she has a yeast infection and is taking medicine and it is getting better. She had no other questions and we reviewed her postpartum appointment next week. Nancy Fetter

## 2023-02-16 ENCOUNTER — Ambulatory Visit: Payer: BC Managed Care – PPO | Admitting: Obstetrics and Gynecology

## 2023-10-29 ENCOUNTER — Other Ambulatory Visit (HOSPITAL_COMMUNITY)
Admission: RE | Admit: 2023-10-29 | Discharge: 2023-10-29 | Disposition: A | Discharge status: Home / Self Care | Payer: BC Managed Care – PPO | Source: Ambulatory Visit | Attending: Obstetrics & Gynecology | Admitting: Obstetrics & Gynecology

## 2023-10-29 ENCOUNTER — Ambulatory Visit: Payer: BC Managed Care – PPO | Admitting: Obstetrics & Gynecology

## 2023-10-29 ENCOUNTER — Other Ambulatory Visit: Payer: Self-pay

## 2023-10-29 ENCOUNTER — Encounter: Payer: Self-pay | Admitting: Obstetrics & Gynecology

## 2023-10-29 VITALS — BP 119/80 | HR 90 | Wt 247.5 lb

## 2023-10-29 DIAGNOSIS — N920 Excessive and frequent menstruation with regular cycle: Secondary | ICD-10-CM

## 2023-10-29 DIAGNOSIS — Z124 Encounter for screening for malignant neoplasm of cervix: Secondary | ICD-10-CM

## 2023-10-29 DIAGNOSIS — N898 Other specified noninflammatory disorders of vagina: Secondary | ICD-10-CM

## 2023-10-29 MED ORDER — IBUPROFEN 600 MG PO TABS: 600.0000 mg | ORAL_TABLET | Freq: Four times a day (QID) | ORAL | 3 refills | Status: DC | PRN

## 2023-10-29 NOTE — Progress Notes (Signed)
GYNECOLOGY CLINIC ANNUAL PREVENTATIVE CARE ENCOUNTER NOTE  Subjective:   Mary Zavala is a 23 y.o. G1P1 female here for a routine annual gynecologic exam.  Current complaints: Last 3 cycles were heavy and lasted 7 days, with pain.She also has menstrual headache. She is undecided about using a BCM or having cycle control.     Gynecologic History Patient's last menstrual period was 10/28/2023. Contraception: none Last Pap: 2022. Results were: normal Last mammogram: n/a.  Obstetric History OB History  Gravida Para Term Preterm AB Living  1 1    1   SAB IAB Ectopic Multiple Live Births      1    # Outcome Date GA Lbr Len/2nd Weight Sex Type Anes PTL Lv  1 Para 09/14/22     Vag-Spont       Past Medical History:  Diagnosis Date   Heart murmur     No past surgical history on file.  Current Outpatient Medications on File Prior to Visit  Medication Sig Dispense Refill   cyclobenzaprine (FLEXERIL) 10 MG tablet Take 1 tablet (10 mg total) by mouth 3 (three) times daily as needed for muscle spasms. 30 tablet 2   ferrous sulfate 325 (65 FE) MG tablet Take 1 tablet (325 mg total) by mouth every other day. 30 tablet 3   fluconazole (DIFLUCAN) 150 MG tablet Take by mouth.     furosemide (LASIX) 20 MG tablet Take 1 tablet (20 mg total) by mouth daily for 3 days. 3 tablet 0   ibuprofen (ADVIL) 600 MG tablet Take 1 tablet (600 mg total) by mouth every 6 (six) hours as needed for mild pain, moderate pain, cramping, headache or fever. 60 tablet 0   NIFEdipine (ADALAT CC) 30 MG 24 hr tablet Take 1 tablet (30 mg total) by mouth daily. 30 tablet 2   Prenatal Vit-Fe Fumarate-FA (PRENATAL MULTIVITAMIN) TABS tablet Take 1 tablet by mouth daily at 12 noon. 30 tablet 3   senna-docusate (SENOKOT-S) 8.6-50 MG tablet Take 2 tablets by mouth daily. (Patient not taking: Reported on 10/21/2022) 30 tablet 2   No current facility-administered medications on file prior to visit.    No Known Allergies  Social  History   Socioeconomic History   Marital status: Single    Spouse name: Not on file   Number of children: Not on file   Years of education: Not on file   Highest education level: Not on file  Occupational History   Not on file  Tobacco Use   Smoking status: Former    Types: Cigars   Smokeless tobacco: Never  Vaping Use   Vaping status: Former   Quit date: 08/28/2021  Substance and Sexual Activity   Alcohol use: Not Currently    Alcohol/week: 2.0 standard drinks of alcohol    Types: 2 Glasses of wine per week   Drug use: Yes    Frequency: 17.0 times per week    Types: Marijuana    Comment: per patient, 2x per day   Sexual activity: Not Currently    Birth control/protection: Condom  Other Topics Concern   Not on file  Social History Narrative   Not on file   Social Drivers of Health   Financial Resource Strain: Not on file  Food Insecurity: No Food Insecurity (10/29/2023)   Hunger Vital Sign    Worried About Running Out of Food in the Last Year: Never true    Ran Out of Food in the Last Year: Never true  Transportation Needs: No Transportation Needs (10/29/2023)   PRAPARE - Administrator, Civil Service (Medical): No    Lack of Transportation (Non-Medical): No  Physical Activity: Not on file  Stress: Not on file  Social Connections: Not on file  Intimate Partner Violence: Not At Risk (09/14/2022)   Humiliation, Afraid, Rape, and Kick questionnaire    Fear of Current or Ex-Partner: No    Emotionally Abused: No    Physically Abused: No    Sexually Abused: No    Family History  Problem Relation Age of Onset   Hypertension Mother    Asthma Mother    Asthma Sister    Asthma Brother    Diabetes Maternal Grandmother    Arthritis Maternal Grandfather    Diabetes Paternal Grandmother    Hypertension Paternal Grandmother    Kidney disease Paternal Grandmother    Arthritis Paternal Grandmother     The following portions of the patient's history were  reviewed and updated as appropriate: allergies, current medications, past family history, past medical history, past social history, past surgical history and problem list.  Review of Systems Genitourinary:positive for abnormal menstrual periods   Objective:  BP 119/80   Pulse 90   Wt 247 lb 8 oz (112.3 kg)   LMP 10/28/2023   Breastfeeding No   BMI 35.51 kg/m  CONSTITUTIONAL: Well-developed, well-nourished female in no acute distress.  HENT:  Normocephalic, atraumatic, External right and left ear normal. Oropharynx is clear and moist EYES: Conjunctivae and EOM are normal. Pupils are equal, round, and reactive to light. No scleral icterus.  NECK: Normal range of motion, supple, no masses.  Normal thyroid.  SKIN: Skin is warm and dry. No rash noted. Not diaphoretic. No erythema. No pallor. NEUROLGIC: Alert and oriented to person, place, and time. Normal reflexes, muscle tone coordination. No cranial nerve deficit noted. PSYCHIATRIC: Normal mood and affect. Normal behavior. Normal judgment and thought content. CARDIOVASCULAR: Normal heart rate noted, regular rhythm RESPIRATORY: Effort normal, no problems with respiration noted. BREASTS: Symmetric in size. No masses, skin changes, nipple drainage, or lymphadenopathy. PELVIC: Normal appearing external genitalia; normal appearing vaginal mucosa and cervix.  No abnormal discharge noted.  Pap smear obtained.  Normal uterine size, no other palpable masses, no uterine or adnexal tenderness. MUSCULOSKELETAL: Normal range of motion. No tenderness.  No cyanosis, clubbing, or edema.    Assessment:  Annual gynecologic examination with pap smear  Vaginal discharge - Plan: Cervicovaginal ancillary only( [place])  Encounter for Papanicolaou smear for cervical cancer screening - Plan: Cytology - PAP( [place])  Menorrhagia with regular cycle - Plan: ibuprofen (ADVIL) 600 MG tablet]  Plan:  Will follow up results of pap smear and manage  accordingly. Routine preventative health maintenance measures emphasized. Please refer to After Visit Summary for other counseling recommendations.  If cycle control or Rx BCM desired please f/u  Scheryl Darter, MD Attending Obstetrician & Gynecologist Center for [facility], [facility]

## 2023-11-01 LAB — CERVICOVAGINAL ANCILLARY ONLY
Bacterial Vaginitis (gardnerella): NEGATIVE
Candida Glabrata: NEGATIVE
Candida Vaginitis: NEGATIVE
Chlamydia: POSITIVE — AB
Comment: NEGATIVE
Comment: NEGATIVE
Comment: NEGATIVE
Comment: NEGATIVE
Comment: NEGATIVE
Comment: NORMAL
Neisseria Gonorrhea: NEGATIVE
Trichomonas: POSITIVE — AB

## 2023-11-02 LAB — CYTOLOGY - PAP
Comment: NEGATIVE
Diagnosis: NEGATIVE
High risk HPV: NEGATIVE

## 2023-11-03 ENCOUNTER — Other Ambulatory Visit: Payer: Self-pay

## 2023-11-03 MED ORDER — DOXYCYCLINE HYCLATE 100 MG PO TABS: 100.0000 mg | ORAL_TABLET | Freq: Every day | ORAL | 0 refills | Status: AC

## 2023-11-03 MED ORDER — METRONIDAZOLE 500 MG PO TABS: 500.0000 mg | ORAL_TABLET | Freq: Two times a day (BID) | ORAL | 0 refills | Status: DC

## 2023-11-12 ENCOUNTER — Ambulatory Visit (INDEPENDENT_AMBULATORY_CARE_PROVIDER_SITE_OTHER): Payer: BC Managed Care – PPO | Admitting: Primary Care

## 2023-11-19 ENCOUNTER — Encounter: Payer: Self-pay | Admitting: *Deleted

## 2024-02-01 ENCOUNTER — Telehealth (INDEPENDENT_AMBULATORY_CARE_PROVIDER_SITE_OTHER): Payer: Self-pay | Admitting: Primary Care

## 2024-02-01 NOTE — Telephone Encounter (Signed)
 Spoke to pt about appt.. Will be present

## 2024-02-02 ENCOUNTER — Encounter (INDEPENDENT_AMBULATORY_CARE_PROVIDER_SITE_OTHER): Admitting: Primary Care

## 2024-02-15 ENCOUNTER — Encounter (INDEPENDENT_AMBULATORY_CARE_PROVIDER_SITE_OTHER): Admitting: Primary Care

## 2024-02-24 ENCOUNTER — Other Ambulatory Visit (HOSPITAL_COMMUNITY)
Admission: RE | Admit: 2024-02-24 | Discharge: 2024-02-24 | Disposition: A | Discharge status: Home / Self Care | Payer: Self-pay | Source: Ambulatory Visit | Attending: Obstetrics and Gynecology | Admitting: Obstetrics and Gynecology

## 2024-02-24 ENCOUNTER — Ambulatory Visit (INDEPENDENT_AMBULATORY_CARE_PROVIDER_SITE_OTHER): Payer: Self-pay | Admitting: Obstetrics and Gynecology

## 2024-02-24 ENCOUNTER — Encounter: Payer: Self-pay | Admitting: Obstetrics and Gynecology

## 2024-02-24 VITALS — BP 136/86 | HR 107 | Wt 249.0 lb

## 2024-02-24 DIAGNOSIS — N939 Abnormal uterine and vaginal bleeding, unspecified: Secondary | ICD-10-CM

## 2024-02-24 DIAGNOSIS — Z113 Encounter for screening for infections with a predominantly sexual mode of transmission: Secondary | ICD-10-CM | POA: Insufficient documentation

## 2024-02-24 MED ORDER — NORETHINDRONE 0.35 MG PO TABS: 1.0000 | ORAL_TABLET | Freq: Every day | ORAL | 11 refills | Status: DC

## 2024-02-24 NOTE — Patient Instructions (Signed)
 Mary Zavala

## 2024-02-24 NOTE — Progress Notes (Signed)
 GYNECOLOGY VISIT  Patient name: Mary Zavala MRN [id number]  Date of birth: 17-Jan-2001 Chief Complaint:   Gynecologic Exam  History:  Mary Zavala is a 23 y.o. G2P0011 being seen today for AUB.  Regular menses until pap and since then has had daily bleeding, heavy and changing her pad.   Discussed the use of AI scribe software for clinical note transcription with the patient, who gave verbal consent to proceed.  History of Present Illness Mary Zavala is a 23 year old female who presents with persistent bleeding since January.  She has experienced persistent bleeding since January, following a Pap smear. Initially, the bleeding was spotting, which she attributed to postpartum changes, but it quickly progressed to continuous and heavy bleeding over the past two to three weeks. This has resulted in significant fatigue and pain.  Prior to the Pap smear, her menstrual cycles were regular. She now notices two different types of blood: 'vibrant, fresh blood' and 'dark, sticky, clotty blood.' She did not receive prenatal or postnatal care due to an unplanned pregnancy and is still adjusting to postpartum changes.  She denies any recent changes in medication or significant weight fluctuations. She has a history of migraines, which may have worsened due to fatigue and blood loss. Despite consistent bleeding, she can differentiate when she is on her period due to cramping and other period symptoms.  During delivery, she did not require a blood transfusion, although she received IVs for an infection and other issues. She describes her delivery as chaotic, occurring unexpectedly at a hospital not equipped for childbirth. Delivered December 2023.  She reports being extremely fatigued, experiencing hot and cold sensations, and having a poor appetite, which she attributes to the blood loss. No new breast or nipple discharge, changes in bowel habits, or new pain outside of her  periods.    Past Medical History:  Diagnosis Date   Heart murmur     No past surgical history on file.  The following portions of the patient's history were reviewed and updated as appropriate: allergies, current medications, past family history, past medical history, past social history, past surgical history and problem list.   Health Maintenance:   Last pap     Component Value Date/Time   DIAGPAP  10/29/2023 0855    - Negative for intraepithelial lesion or malignancy (NILM)   HPVHIGH Negative 10/29/2023 0855   ADEQPAP  10/29/2023 0855    Satisfactory for evaluation; transformation zone component PRESENT.   Last mammogram: n/a   Review of Systems:  Pertinent items are noted in HPI. Comprehensive review of systems was otherwise negative.   Objective:  Physical Exam BP 136/86   Pulse (!) 107   Wt 249 lb (112.9 kg)   BMI 35.73 kg/m    Physical Exam Vitals and nursing note reviewed.  Constitutional:      Appearance: Normal appearance.  HENT:     Head: Normocephalic and atraumatic.  Pulmonary:     Effort: Pulmonary effort is normal.  Skin:    General: Skin is warm and dry.  Neurological:     General: No focal deficit present.     Mental Status: She is alert.  Psychiatric:        Mood and Affect: Mood normal.        Behavior: Behavior normal.        Thought Content: Thought content normal.        Judgment: Judgment normal.  Assessment & Plan:   Assessment & Plan Abnormal uterine bleeding Chronic heavy bleeding since January, worsening over 2-3 weeks with fatigue and pain. Differential includes hormonal imbalance, postpartum changes, or other gynecological conditions. Prefers identifying underlying issue over symptom suppression. Non-hormonal treatment (Lysteda) discussed for short-term management. Open to hormonal treatments if necessary. - Order thyroid function tests and complete blood count. - Schedule pelvic ultrasound. - Discuss non-hormonal  treatment option (Lysteda) for short-term use vs micronor  given short duration of use of lysteda  - Discuss potential hormonal treatments if non-hormonal option is ineffective. - Advise application for Medicaid or financial assistance for coverage.  Fatigue Possibly secondary to chronic blood loss from abnormal uterine bleeding. - Address underlying cause by managing abnormal uterine bleeding. - Order blood work to assess for anemia.  Routine preventative health maintenance measures emphasized.  Kiki Pelton, MD Minimally Invasive Gynecologic Surgery Center for [facility], [facility]

## 2024-02-25 ENCOUNTER — Ambulatory Visit: Payer: Self-pay | Admitting: Obstetrics and Gynecology

## 2024-02-25 DIAGNOSIS — B9689 Other specified bacterial agents as the cause of diseases classified elsewhere: Secondary | ICD-10-CM

## 2024-02-25 LAB — CERVICOVAGINAL ANCILLARY ONLY
Bacterial Vaginitis (gardnerella): POSITIVE — AB
Candida Glabrata: NEGATIVE
Candida Vaginitis: NEGATIVE
Chlamydia: NEGATIVE
Comment: NEGATIVE
Comment: NEGATIVE
Comment: NEGATIVE
Comment: NEGATIVE
Comment: NEGATIVE
Comment: NORMAL
Neisseria Gonorrhea: NEGATIVE
Trichomonas: NEGATIVE

## 2024-02-25 LAB — CBC
Hematocrit: 33 % — ABNORMAL LOW (ref 34.0–46.6)
Hemoglobin: 10.4 g/dL — ABNORMAL LOW (ref 11.1–15.9)
MCH: 27 pg (ref 26.6–33.0)
MCHC: 31.5 g/dL (ref 31.5–35.7)
MCV: 86 fL (ref 79–97)
Platelets: 325 10*3/uL (ref 150–450)
RBC: 3.85 x10E6/uL (ref 3.77–5.28)
RDW: 13.7 % (ref 11.7–15.4)
WBC: 6.8 10*3/uL (ref 3.4–10.8)

## 2024-02-25 LAB — TSH RFX ON ABNORMAL TO FREE T4: TSH: 1.4 u[IU]/mL (ref 0.450–4.500)

## 2024-02-25 LAB — FOLLICLE STIMULATING HORMONE: FSH: 4.8 m[IU]/mL

## 2024-02-25 MED ORDER — METRONIDAZOLE 500 MG PO TABS: 500.0000 mg | ORAL_TABLET | Freq: Two times a day (BID) | ORAL | 0 refills | Status: AC

## 2024-03-09 ENCOUNTER — Ambulatory Visit (HOSPITAL_COMMUNITY)
Admission: RE | Admit: 2024-03-09 | Discharge: 2024-03-09 | Disposition: A | Discharge status: Home / Self Care | Payer: Self-pay | Source: Ambulatory Visit | Attending: Obstetrics and Gynecology | Admitting: Obstetrics and Gynecology

## 2024-03-09 DIAGNOSIS — N939 Abnormal uterine and vaginal bleeding, unspecified: Secondary | ICD-10-CM | POA: Diagnosis present

## 2024-04-06 ENCOUNTER — Ambulatory Visit: Payer: Self-pay | Admitting: Obstetrics and Gynecology

## 2024-04-06 ENCOUNTER — Telehealth: Admitting: Family Medicine

## 2024-04-06 NOTE — Progress Notes (Signed)
 GYNECOLOGY VISIT  Patient name: Mary Zavala MRN [id number]  Date of birth: 10-27-2000 Chief Complaint:   Follow-up  History:  Mary Zavala is a 23 y.o. G2P0011 being seen today for AUB follow up.  Bled for a week in June, moderate bleeding and has not bled again since  Last seen 02/24/24: normal sense until pap, after which persistent bleeding. Offered micronor  vs lysteda and US  ordered. Micronor  provided  Not currently taking micronor  but feeling like she may be back on her regular cycle. Feels like she is back on her period. Had a week long period. Feels like discharge is discolored, dark red/brown and having some pain - nothing that is filling up a pad. Having more pain on the right then the left. No strange smell to discharge and having some random pain . Did not take micronor  and used oregono oil and felt that helpe dhte most  Past Medical History:  Diagnosis Date   Heart murmur     No past surgical history on file.  The following portions of the patient's history were reviewed and updated as appropriate: allergies, current medications, past family history, past medical history, past social history, past surgical history and problem list.   Health Maintenance:   Last pap     Component Value Date/Time   DIAGPAP  10/29/2023 0855    - Negative for intraepithelial lesion or malignancy (NILM)   HPVHIGH Negative 10/29/2023 0855   ADEQPAP  10/29/2023 0855    Satisfactory for evaluation; transformation zone component PRESENT.    High Risk HPV: Positive  Adequacy:  Satisfactory for evaluation, transformation zone component PRESENT  Diagnosis:  Atypical squamous cells of undetermined significance (ASC-US )  Last mammogram: n/a   Review of Systems:  Pertinent items are noted in HPI. Comprehensive review of systems was otherwise negative.   Objective:  Physical Exam BP 124/73   Pulse 77   Wt 250 lb (113.4 kg)   LMP 03/12/2024 (Within Days) Comment: lasted 7  days  BMI 35.87 kg/m    Physical Exam Vitals and nursing note reviewed. Exam conducted with a chaperone present.  Constitutional:      Appearance: Normal appearance.  HENT:     Head: Normocephalic and atraumatic.  Pulmonary:     Effort: Pulmonary effort is normal.     Breath sounds: Normal breath sounds.  Genitourinary:    General: Normal vulva.     Exam position: Lithotomy position.     Vagina: Normal.     Cervix: Normal.     Comments: Tender right levator ani = replicates pain Painful left levator ani, right more than left No CMT appreciated though difficult to asceratin Small volume discharge though not red brown  Skin:    General: Skin is warm and dry.  Neurological:     General: No focal deficit present.     Mental Status: She is alert.  Psychiatric:        Mood and Affect: Mood normal.        Behavior: Behavior normal.        Thought Content: Thought content normal.        Judgment: Judgment normal.    Labs and Imaging US  PELVIC COMPLETE WITH TRANSVAGINAL Result Date: 03/13/2024 CLINICAL DATA:  Abnormal uterine bleeding EXAM: TRANSABDOMINAL AND TRANSVAGINAL ULTRASOUND OF PELVIS TECHNIQUE: Both transabdominal and transvaginal ultrasound examinations of the pelvis were performed. Transabdominal technique was performed for global imaging of the pelvis including uterus, ovaries, adnexal regions, and pelvic  cul-de-sac. It was necessary to proceed with endovaginal exam following the transabdominal exam to visualize the uterus endometrium adnexa. COMPARISON:  None Available. FINDINGS: Uterus Measurements: 7.5 x 4 x 6.4 cm = volume: 100.9 mL. No fibroids or other mass visualized. Endometrium Thickness: 19 mm.  Small cyst measuring 7 mm. Right ovary Measurements: 3.9 x 3.4 x 3 cm = volume: 14.5 mL. Normal appearance/no adnexal mass. Left ovary Measurements: 3.6 x 2.7 x 3 cm = volume: 15.3 mL. Normal appearance/no adnexal mass. Other findings Small volume free fluid in the pelvis  IMPRESSION: 1. Endometrial thickness of 19 mm. If bleeding remains unresponsive to hormonal or medical therapy, focal lesion work-up with sonohysterogram should be considered. Endometrial biopsy should also be considered in pre-menopausal patients at high risk for endometrial carcinoma. (Ref: Radiological Reasoning: Algorithmic Workup of Abnormal Vaginal Bleeding with Endovaginal Sonography and Sonohysterography. AJR 2008; 808:D31-26) 2. Small volume free fluid in the pelvis Electronically Signed   By: Luke Bun M.D.   On: 03/13/2024 22:22       Assessment & Plan:   1. Endometritis (Primary) Given diagnosis of trich and chlamydia with pap and subsequent bleeding and report of abnormal discharge, will treat for presumptive endometritis.  - doxycycline  (VIBRAMYCIN ) 100 MG capsule; Take 1 capsule (100 mg total) by mouth 2 (two) times daily for 14 days.  Dispense: 28 capsule; Refill: 0  2. Vaginal discharge Vaginitis today  - Cervicovaginal ancillary only   Routine preventative health maintenance measures emphasized.  Carter Quarry, MD Minimally Invasive Gynecologic Surgery Center for [facility], [facility]

## 2024-04-06 NOTE — Progress Notes (Signed)
 The patient no-showed for appointment despite this provider sending direct link, reaching out via phone with no response and waiting for at least 10 minutes from appointment time for patient to join. They will be marked as a NS for this appointment/time.   Freddy Finner, NP

## 2024-04-07 ENCOUNTER — Other Ambulatory Visit (HOSPITAL_COMMUNITY)
Admission: RE | Admit: 2024-04-07 | Discharge: 2024-04-07 | Disposition: A | Discharge status: Home / Self Care | Source: Ambulatory Visit | Attending: Obstetrics and Gynecology | Admitting: Obstetrics and Gynecology

## 2024-04-07 ENCOUNTER — Other Ambulatory Visit: Payer: Self-pay

## 2024-04-07 ENCOUNTER — Ambulatory Visit: Admitting: Obstetrics and Gynecology

## 2024-04-07 VITALS — BP 124/73 | HR 77 | Wt 250.0 lb

## 2024-04-07 DIAGNOSIS — N719 Inflammatory disease of uterus, unspecified: Secondary | ICD-10-CM

## 2024-04-07 DIAGNOSIS — N898 Other specified noninflammatory disorders of vagina: Secondary | ICD-10-CM | POA: Insufficient documentation

## 2024-04-07 MED ORDER — DOXYCYCLINE HYCLATE 100 MG PO CAPS: 100.0000 mg | ORAL_CAPSULE | Freq: Two times a day (BID) | ORAL | 0 refills | Status: AC

## 2024-04-10 LAB — CERVICOVAGINAL ANCILLARY ONLY
Bacterial Vaginitis (gardnerella): NEGATIVE
Candida Glabrata: NEGATIVE
Candida Vaginitis: NEGATIVE
Chlamydia: NEGATIVE
Comment: NEGATIVE
Comment: NEGATIVE
Comment: NEGATIVE
Comment: NEGATIVE
Comment: NEGATIVE
Comment: NORMAL
Neisseria Gonorrhea: NEGATIVE
Trichomonas: NEGATIVE

## 2024-04-12 ENCOUNTER — Ambulatory Visit: Payer: Self-pay | Admitting: Obstetrics and Gynecology

## 2024-06-30 ENCOUNTER — Ambulatory Visit (INDEPENDENT_AMBULATORY_CARE_PROVIDER_SITE_OTHER): Admitting: Primary Care

## 2024-07-12 ENCOUNTER — Ambulatory Visit (INDEPENDENT_AMBULATORY_CARE_PROVIDER_SITE_OTHER): Admitting: Primary Care

## 2024-07-12 ENCOUNTER — Ambulatory Visit: Admitting: Nurse Practitioner

## 2024-07-17 ENCOUNTER — Telehealth (INDEPENDENT_AMBULATORY_CARE_PROVIDER_SITE_OTHER): Payer: Self-pay | Admitting: Primary Care

## 2024-07-17 NOTE — Telephone Encounter (Signed)
 Called pt to reschedule appt to a day where provider will be in office. Pt did not answer and LVM for pt

## 2024-07-18 ENCOUNTER — Ambulatory Visit (INDEPENDENT_AMBULATORY_CARE_PROVIDER_SITE_OTHER): Admitting: Primary Care

## 2024-07-25 ENCOUNTER — Emergency Department (HOSPITAL_COMMUNITY)

## 2024-07-25 ENCOUNTER — Other Ambulatory Visit: Payer: Self-pay

## 2024-07-25 ENCOUNTER — Emergency Department (HOSPITAL_COMMUNITY)
Admission: EM | Admit: 2024-07-25 | Discharge: 2024-07-25 | Disposition: A | Discharge status: Home / Self Care | Attending: Emergency Medicine | Admitting: Emergency Medicine

## 2024-07-25 DIAGNOSIS — R202 Paresthesia of skin: Secondary | ICD-10-CM | POA: Diagnosis not present

## 2024-07-25 DIAGNOSIS — R519 Headache, unspecified: Secondary | ICD-10-CM | POA: Diagnosis present

## 2024-07-25 MED ORDER — METOCLOPRAMIDE HCL 5 MG/ML IJ SOLN
10.0000 mg | Freq: Once | INTRAMUSCULAR | Status: AC
  Administered 2024-07-25: 10 mg via INTRAMUSCULAR
  Filled 2024-07-25: qty 2

## 2024-07-25 MED ORDER — DIPHENHYDRAMINE HCL 25 MG PO CAPS
25.0000 mg | ORAL_CAPSULE | Freq: Once | ORAL | Status: AC
  Administered 2024-07-25: 25 mg via ORAL
  Filled 2024-07-25: qty 1

## 2024-07-25 NOTE — ED Notes (Signed)
 EKG at 1358 did not transfer to chart, paper copy given to MD. Repeat at 1431 transferred over and given to provider.

## 2024-07-25 NOTE — ED Provider Notes (Signed)
 [name] Provider Note   CSN: [id number] Arrival date & time: 07/25/24  1042     Patient presents with: Tingling   Mary Zavala is a 23 y.o. female.   Patient with history of migraine headaches presents to the emergency department for evaluation of a left-sided facial paresthesia/burning/sensation of stiffness to her left face.  Headache is generalized over the left side of the head.  She has associated light sensitivity and pain behind the right eye.  Pain came on gradually, no thunderclap.  She denies fever, ear pain, runny nose or sore throat.  No hazy or foggy vision. Patient denies signs of stroke including: facial droop, slurred speech, aphasia, weakness/numbness in extremities, imbalance/trouble walking.  Patient did report that she passed out about 1 week ago while at work with a headache.        Prior to Admission medications   Medication Sig Start Date End Date Taking? Authorizing Provider  cyclobenzaprine  (FLEXERIL ) 10 MG tablet Take 1 tablet (10 mg total) by mouth 3 (three) times daily as needed for muscle spasms. Patient not taking: Reported on 04/07/2024 09/16/22   Herchel Gloris LABOR, MD  ferrous sulfate  325 (65 FE) MG tablet Take 1 tablet (325 mg total) by mouth every other day. Patient not taking: Reported on 04/07/2024 09/17/22   Anyanwu, Ugonna A, MD  fluconazole (DIFLUCAN) 150 MG tablet Take by mouth. Patient not taking: Reported on 04/07/2024 02/10/23   [provider]  furosemide  (LASIX ) 20 MG tablet Take 1 tablet (20 mg total) by mouth daily for 3 days. Patient not taking: Reported on 04/07/2024 10/24/22 10/27/22  Anyanwu, Ugonna A, MD  ibuprofen  (ADVIL ) 600 MG tablet Take 1 tablet (600 mg total) by mouth every 6 (six) hours as needed for mild pain, moderate pain, cramping, headache or fever. Patient not taking: Reported on 04/07/2024 10/24/22   Anyanwu, Ugonna A, MD  ibuprofen  (ADVIL ) 600 MG tablet Take 1  tablet (600 mg total) by mouth every 6 (six) hours as needed. Patient not taking: Reported on 04/07/2024 10/29/23   Eveline Lynwood MATSU, MD  NIFEdipine  (ADALAT  CC) 30 MG 24 hr tablet Take 1 tablet (30 mg total) by mouth daily. Patient not taking: Reported on 04/07/2024 09/16/22   Anyanwu, Ugonna A, MD  norethindrone  (MICRONOR ) 0.35 MG tablet Take 1 tablet (0.35 mg total) by mouth daily. Patient not taking: Reported on 04/07/2024 02/24/24   Ajewole, Christana, MD  Prenatal Vit-Fe Fumarate-FA (PRENATAL MULTIVITAMIN) TABS tablet Take 1 tablet by mouth daily at 12 noon. Patient not taking: Reported on 04/07/2024 09/16/22   Anyanwu, Ugonna A, MD  senna-docusate (SENOKOT-S) 8.6-50 MG tablet Take 2 tablets by mouth daily. Patient not taking: Reported on 04/07/2024 09/16/22   Anyanwu, Ugonna A, MD    Allergies: Patient has no known allergies.    Review of Systems  Updated Vital Signs BP 107/87 (BP Location: Right Arm)   Pulse 64   Temp 97.8 F (36.6 C) (Oral)   Resp 18   Ht 5' 10 (1.778 m)   Wt 113.4 kg   LMP 07/10/2024   SpO2 98%   BMI 35.87 kg/m   Physical Exam Vitals and nursing note reviewed.  Constitutional:      Appearance: She is well-developed.  HENT:     Head: Normocephalic and atraumatic.     Right Ear: Tympanic membrane, ear canal and external ear normal.     Left Ear: Tympanic membrane, ear canal and external ear  normal.     Nose: Nose normal.     Mouth/Throat:     Pharynx: Uvula midline.  Eyes:     General: Lids are normal.     Extraocular Movements:     Right eye: No nystagmus.     Left eye: No nystagmus.     Conjunctiva/sclera: Conjunctivae normal.     Pupils: Pupils are equal, round, and reactive to light.  Cardiovascular:     Rate and Rhythm: Normal rate and regular rhythm.  Pulmonary:     Effort: Pulmonary effort is normal.     Breath sounds: Normal breath sounds.  Abdominal:     Palpations: Abdomen is soft.     Tenderness: There is no abdominal tenderness.   Musculoskeletal:     Cervical back: Normal range of motion and neck supple. No tenderness or bony tenderness.  Skin:    General: Skin is warm and dry.  Neurological:     Mental Status: She is alert and oriented to person, place, and time.     GCS: GCS eye subscore is 4. GCS verbal subscore is 5. GCS motor subscore is 6.     Cranial Nerves: No cranial nerve deficit, dysarthria or facial asymmetry.     Sensory: Sensory deficit (Reports decree sensation left face, but no numbness) present.     Motor: No weakness.     Coordination: Coordination normal.     Gait: Gait normal.     Comments: Upper extremity myotomes tested bilaterally:  C5 Shoulder abduction 5/5 C6 Elbow flexion/wrist extension 5/5 C7 Elbow extension 5/5 C8 Finger flexion 5/5 T1 Finger abduction 5/5  Lower extremity myotomes tested bilaterally: L2 Hip flexion 5/5 L3 Knee extension 5/5 L4 Ankle dorsiflexion 5/5 S1 Ankle plantar flexion 5/5      (all labs ordered are listed, but only abnormal results are displayed) Labs Reviewed - No data to display  ED ECG REPORT   Date: 07/25/2024  Rate: 64  Rhythm: normal sinus rhythm  QRS Axis: normal  Intervals: normal  ST/T Wave abnormalities: normal  Conduction Disutrbances:none  Narrative Interpretation: No prolonged QT, WPW findings, Brugada syndrome, heart block or arrhythmia.  Old EKG Reviewed: unchanged  I have personally reviewed the EKG tracing and agree with the computerized printout as noted.   Radiology: CT Head Wo Contrast Result Date: 07/25/2024 CLINICAL DATA:  Headache with left facial paresthesia EXAM: CT HEAD WITHOUT CONTRAST TECHNIQUE: Contiguous axial images were obtained from the base of the skull through the vertex without intravenous contrast. RADIATION DOSE REDUCTION: This exam was performed according to the departmental dose-optimization program which includes automated exposure control, adjustment of the mA and/or kV according to patient size  and/or use of iterative reconstruction technique. COMPARISON:  None Available. FINDINGS: Brain: No evidence of acute infarction, hemorrhage, hydrocephalus, extra-axial collection or mass lesion/mass effect. Vascular: No hyperdense vessel or unexpected calcification. Skull: Normal. Negative for fracture or focal lesion. Sinuses/Orbits: No acute finding.  Mild mucosal thickening. Other: None IMPRESSION: Negative non contrasted CT appearance of the brain. Electronically Signed   By: Luke Bun M.D.   On: 07/25/2024 15:12     Procedures   Medications Ordered in the ED  metoCLOPramide (REGLAN) injection 10 mg (10 mg Intramuscular Given 07/25/24 1502)  diphenhydrAMINE  (BENADRYL ) capsule 25 mg (25 mg Oral Given 07/25/24 1502)    ED Course  Patient seen and examined. History obtained directly from patient. She has small child at bedside.   Labs/EKG: EKG ordered  Imaging: Ordered CT  head.  Medications/Fluids: Ordered: Migraine cocktail, IM Reglan, p.o. Benadryl .  Most recent vital signs reviewed and are as follows: BP 107/87 (BP Location: Right Arm)   Pulse 64   Temp 97.8 F (36.6 C) (Oral)   Resp 18   Ht 5' 10 (1.778 m)   Wt 113.4 kg   LMP 07/10/2024   SpO2 98%   BMI 35.87 kg/m   Initial impression: Headache with left-sided paresthesias of the face only.  No weakness.  3:21 PM Reassessment performed. Patient appears comfortable, stable.  She states that her facial pain is better.  Continues to have some pain behind her right eye.  No distress.  No vision changes or loss.  Imaging personally visualized and interpreted including: CT head was normal.  Reviewed pertinent lab work and imaging with patient at bedside. Questions answered.   Most current vital signs reviewed and are as follows: BP 107/87 (BP Location: Right Arm)   Pulse 64   Temp 97.8 F (36.6 C) (Oral)   Resp 18   Ht 5' 10 (1.778 m)   Wt 113.4 kg   LMP 07/10/2024   SpO2 98%   BMI 35.87 kg/m   Plan:  Discharge to home.   Prescriptions written for: None  Other home care instructions discussed: Avoidance of triggers, continue OTC meds.  ED return instructions discussed: Patient counseled to return if they have weakness in their arms or legs, slurred speech, trouble walking or talking, confusion, trouble with their balance, or if they have any other concerns. Patient verbalizes understanding and agrees with plan.   Follow-up instructions discussed: Patient encouraged to follow-up with their PCP in 7 days would be ideal, she has an appointment scheduled for 15th of next month.                                  Medical Decision Making Amount and/or Complexity of Data Reviewed Radiology: ordered.  Risk Prescription drug management.   In regards to the patient's headache, critical differentials were considered including subarachnoid hemorrhage, intracerebral hemorrhage, epidural/subdural hematoma, pituitary apoplexy, vertebral/carotid artery dissection, giant cell arteritis, central venous thrombosis, reversible cerebral vasoconstriction, acute angle closure glaucoma, idiopathic intracranial hypertension, bacterial meningitis, viral encephalitis, carbon monoxide poisoning, posterior reversible encephalopathy syndrome, pre-eclampsia.  Considered possibility of venous sinus thrombosis, however symptoms atypical and patient without other risk factors.  Reg flag symptoms related to these causes were considered including systemic symptoms (fever, weight loss), neurologic symptoms (confusion, mental status change, vision change, associated seizure), acute or sudden thunderclap onset, patient age 44 or older with new or progressive headache, patient of any age with first headache or change in headache pattern, pregnant or postpartum status, history of HIV or other immunocompromise, history of cancer, headache occurring with exertion, associated neck or shoulder pain, associated traumatic injury,  concurrent use of anticoagulation, family history of spontaneous SAH, and concurrent drug use.    Other benign, more common causes of headache were considered including migraine, tension-type headache, cluster headache, referred pain from other cause such as sinus infection, dental pain, trigeminal neuralgia.   On exam, patient has a reassuring neuro exam including baseline mental status, no significant neck pain or meningeal signs, no signs of severe infection or fever.   The patient's vital signs, pertinent lab work and imaging were reviewed and interpreted as discussed in the ED course. Hospitalization was considered for further testing, treatments, or serial exams/observation. However as patient is  well-appearing, has a stable exam over the course of their evaluation, and reassuring studies today, I do not feel that they warrant admission at this time. This plan was discussed with the patient who verbalizes agreement and comfort with this plan and seems reliable and able to return to the Emergency Department with worsening or changing symptoms.       Final diagnoses:  Acute nonintractable headache, unspecified headache type  Paresthesia    ED Discharge Orders     None          Desiderio Chew, PA-C 07/25/24 1526    Dasie Faden, MD 07/26/24 1550

## 2024-07-25 NOTE — Discharge Instructions (Addendum)
 Please read and follow all provided instructions.  Your diagnoses today include:  1. Acute nonintractable headache, unspecified headache type   2. Paresthesia     Tests performed today include: CT of your head which was normal and did not show any serious cause of your headache Vital signs. See below for your results today.   Medications:  In the Emergency Department you received: Reglan - antinausea/headache medication Benadryl  - antihistamine to counteract potential side effects of reglan  Take any prescribed medications only as directed.  Additional information:  Follow any educational materials contained in this packet.  You are having a headache. No specific cause was found today for your headache. It may have been a migraine or other cause of headache. Stress, anxiety, fatigue, and depression are common triggers for headaches.   Your headache today does not appear to be life-threatening or require hospitalization, but often the exact cause of headaches is not determined in the emergency department. Therefore, follow-up with your doctor is very important to find out what may have caused your headache and whether or not you need any further diagnostic testing or treatment.   Sometimes headaches can appear benign (not harmful), but then more serious symptoms can develop which should prompt an immediate re-evaluation by your doctor or the emergency department.  BE VERY CAREFUL not to take multiple medicines containing Tylenol  (also called acetaminophen ). Doing so can lead to an overdose which can damage your liver and cause liver failure and possibly death.   Follow-up instructions: Please follow-up with your primary care provider in the next 3 days for further evaluation of your symptoms.   Return instructions:  Please return to the Emergency Department if you experience worsening symptoms. Return if the medications do not resolve your headache, if it recurs, or if you have  multiple episodes of vomiting or cannot keep down fluids. Return if you have a change from the usual headache. RETURN IMMEDIATELY IF you: Develop a sudden, severe headache Develop confusion or become poorly responsive or faint Develop a fever above 100.60F or problem breathing Have a change in speech, vision, swallowing, or understanding Develop new weakness, numbness, tingling, incoordination in your arms or legs Have a seizure Please return if you have any other emergent concerns.  Additional Information:  Your vital signs today were: BP 107/87 (BP Location: Right Arm)   Pulse 64   Temp 97.8 F (36.6 C) (Oral)   Resp 18   Ht 5' 10 (1.778 m)   Wt 113.4 kg   LMP 07/10/2024   SpO2 98%   BMI 35.87 kg/m  If your blood pressure (BP) was elevated above 135/85 this visit, please have this repeated by your doctor within one month. --------------

## 2024-07-25 NOTE — ED Triage Notes (Signed)
 Pt reports left sided facial numbness for 2 days. Reports it is painful to move her face and talk.

## 2024-07-25 NOTE — ED Triage Notes (Signed)
 Pt has painful tingling and stiffness to left side of face for 2 days. Hurts worse with palpation. Also having migraines for months. No other neuro symptoms.

## 2024-08-08 ENCOUNTER — Encounter (INDEPENDENT_AMBULATORY_CARE_PROVIDER_SITE_OTHER): Payer: Self-pay

## 2024-08-08 ENCOUNTER — Ambulatory Visit: Payer: Self-pay

## 2024-08-08 ENCOUNTER — Ambulatory Visit (INDEPENDENT_AMBULATORY_CARE_PROVIDER_SITE_OTHER): Admitting: Primary Care

## 2024-08-08 ENCOUNTER — Encounter: Payer: Self-pay | Admitting: Family

## 2024-08-08 NOTE — Telephone Encounter (Signed)
 Noted

## 2024-08-08 NOTE — Progress Notes (Signed)
 Erroneous encounter-disregard

## 2024-08-08 NOTE — Telephone Encounter (Signed)
 FYI Only or Action Required?: Action required by provider: request for appointment.  Patient was last seen in primary care on 05/13/2022 by Mary Rosaline SQUIBB, NP.  Called Nurse Triage reporting Vaginal Bleeding.  Symptoms began several months ago.  Interventions attempted: Nothing.  Symptoms are: unchanged.Pt. Missed appointment today with PCP. Having heavy, painful periods. Has seen OB/GYN and they say nothing is wrong.  Triage Disposition: See PCP When Office is Open (Within 3 Days)  Patient/caregiver understands and will follow disposition?:      Copied from CRM #[id number]. Topic: Clinical - Red Word Triage >> Aug 08, 2024  1:53 PM Lonell PEDLAR wrote: Red Word that prompted transfer to Nurse Triage: Patient with severe menses bleeding and pain. Reason for Disposition  [1] Heavier than normal periods (e.g., doubling up on pads to prevent leaking, needing to change pads overnight, unable to do normal activities) AND [2] last > 7 days  Answer Assessment - Initial Assessment Questions 1. BLEEDING SEVERITY: Describe the bleeding that you are having. How much bleeding is there?      heavy 2. ONSET: When did the bleeding begin? Is it continuing now?     Post partum x 1 year ago 3. MENSTRUAL PERIOD: When was the last normal menstrual period? How is this different than your period?     pain 4. REGULARITY: How regular are your periods?     regular 5. ABDOMEN PAIN: Do you have any pain? How bad is the pain?  (e.g., Scale 0-10; none, mild, moderate, or severe)     moderate 6. PREGNANCY: Is there any chance you are pregnant? When was your last menstrual period?     no 7. BREASTFEEDING: Are you breastfeeding?     no 8. HORMONE MEDICINES: Are you taking any hormone medicines, prescription or over-the-counter? (e.g., birth control pills, estrogen)     no 9. BLOOD THINNER MEDICINES: Do you take any blood thinners? (e.g., Coumadin / warfarin, Pradaxa / dabigatran,  aspirin)     no 10. CAUSE: What do you think is causing the bleeding? (e.g., recent gyn surgery, recent gyn procedure; known bleeding disorder, cervical cancer, polycystic ovarian disease, fibroids)         unsure 11. HEMODYNAMIC STATUS: Are you weak or feeling lightheaded? If Yes, ask: Can you stand and walk normally?        tired 12. OTHER SYMPTOMS: What other symptoms are you having with the bleeding? (e.g., passed tissue, vaginal discharge, fever, menstrual-type cramps)       Heavy cramps  Protocols used: Vaginal Bleeding - Abnormal-A-AH

## 2024-08-16 ENCOUNTER — Ambulatory Visit (INDEPENDENT_AMBULATORY_CARE_PROVIDER_SITE_OTHER): Admitting: Primary Care

## 2024-09-14 ENCOUNTER — Ambulatory Visit: Admitting: Obstetrics and Gynecology

## 2024-10-11 ENCOUNTER — Ambulatory Visit (HOSPITAL_COMMUNITY)
Admission: EM | Admit: 2024-10-11 | Discharge: 2024-10-11 | Disposition: A | Discharge status: Home / Self Care | Attending: Family Medicine | Admitting: Family Medicine

## 2024-10-11 ENCOUNTER — Ambulatory Visit (HOSPITAL_COMMUNITY)

## 2024-10-11 ENCOUNTER — Encounter (HOSPITAL_COMMUNITY): Payer: Self-pay

## 2024-10-11 DIAGNOSIS — M25562 Pain in left knee: Secondary | ICD-10-CM | POA: Diagnosis not present

## 2024-10-11 DIAGNOSIS — Z113 Encounter for screening for infections with a predominantly sexual mode of transmission: Secondary | ICD-10-CM | POA: Diagnosis not present

## 2024-10-11 LAB — POCT URINE PREGNANCY: Preg Test, Ur: NEGATIVE

## 2024-10-11 LAB — HIV ANTIBODY (ROUTINE TESTING W REFLEX): HIV Screen 4th Generation wRfx: NONREACTIVE

## 2024-10-11 MED ORDER — IBUPROFEN 800 MG PO TABS: 800.0000 mg | ORAL_TABLET | Freq: Three times a day (TID) | ORAL | 0 refills | Status: AC

## 2024-10-11 NOTE — ED Triage Notes (Signed)
 Pt states going down steps yesterday and bent her lt knee backwards. C/o pain and swelling. States using icy hot with some relief.   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 Requesting all STD testing. States 2 years post partum and having abnormal bleeding since.

## 2024-10-12 ENCOUNTER — Ambulatory Visit (HOSPITAL_COMMUNITY): Payer: Self-pay

## 2024-10-12 LAB — CERVICOVAGINAL ANCILLARY ONLY
Chlamydia: NEGATIVE
Comment: NEGATIVE
Comment: NEGATIVE
Comment: NORMAL
Neisseria Gonorrhea: NEGATIVE
Trichomonas: NEGATIVE

## 2024-10-12 LAB — T.PALLIDUM AB, TOTAL: T Pallidum Abs: REACTIVE — AB

## 2024-10-12 LAB — SYPHILIS: RPR W/REFLEX TO RPR TITER AND TREPONEMAL ANTIBODIES, TRADITIONAL SCREENING AND DIAGNOSIS ALGORITHM
RPR Ser Ql: REACTIVE — AB
RPR Titer: 1:16 {titer}

## 2024-10-12 MED ORDER — DOXYCYCLINE HYCLATE 100 MG PO TABS: 100.0000 mg | ORAL_TABLET | Freq: Two times a day (BID) | ORAL | 0 refills | Status: AC

## 2024-10-12 NOTE — ED Provider Notes (Signed)
 " [facility]ENTER   [id number] 10/11/24 Arrival Time: 1653  ASSESSMENT & PLAN:  1. Acute pain of left knee   2. Screening for STDs (sexually transmitted diseases)    Reports + RPR at [facility]. Without known exposure.  Labs Pending:  SYPHILIS: RPR W/REFLEX TO RPR TITER AND TREPONEMAL ANTIBODIES, TRADITIONAL SCREENING AND DIAGNOSIS ALGORITHM   HIV ANTIBODY (ROUTINE TESTING W REFLEX)  T.PALLIDUM AB, TOTAL  POCT URINE PREGNANCY  CERVICOVAGINAL ANCILLARY ONLY     Will notify of any positive results. Instructed to refrain from sexual activity for at least seven days.  Reviewed expectations re: course of current medical issues. Questions answered. Outlined signs and symptoms indicating need for more acute intervention. Patient verbalized understanding. After Visit Summary given.   SUBJECTIVE:  Mary Zavala is a 24 y.o. female who requests STD screening. + RPR at [facility]. No symptoms. Also with L knee pain; bent backward while on stairs yesterday; still sore. Is ambulatory. Denies extremity sensation changes or weakness.   No LMP recorded.   OBJECTIVE:  Vitals:   10/11/24 1759  BP: (!) 134/96  Pulse: 96  Resp: 16  Temp: 98.6 F (37 C)  TempSrc: Oral  SpO2: 100%     General appearance: alert, cooperative, appears stated age and no distress Lungs: unlabored respirations; speaks full sentences without difficulty Back: no CVA tenderness; FROM at waist Abdomen: soft, non-tender GU: deferred LLE: no specific bony TTP of L knee; FROM but with pain on extension; no instability Skin: warm and dry Psychological: alert and cooperative; normal mood and affect.   Allergies[1]  Past Medical History:  Diagnosis Date   Heart murmur    Family History  Problem Relation Age of Onset   Hypertension Mother    Asthma Mother    Asthma Sister    Asthma Brother    Diabetes Maternal Grandmother    Arthritis Maternal Grandfather     Diabetes Paternal Grandmother    Hypertension Paternal Grandmother    Kidney disease Paternal Grandmother    Arthritis Paternal Grandmother    Social History   Socioeconomic History   Marital status: Single    Spouse name: Not on file   Number of children: Not on file   Years of education: Not on file   Highest education level: Associate degree: academic program  Occupational History   Not on file  Tobacco Use   Smoking status: Former    Types: Cigars   Smokeless tobacco: Never  Vaping Use   Vaping status: Former   Quit date: 08/28/2021  Substance and Sexual Activity   Alcohol use: Not Currently    Alcohol/week: 2.0 standard drinks of alcohol    Types: 2 Glasses of wine per week   Drug use: Yes    Frequency: 17.0 times per week    Types: Marijuana    Comment: per patient, 2x per day   Sexual activity: Not Currently    Birth control/protection: Condom  Other Topics Concern   Not on file  Social History Narrative   Not on file   Social Drivers of Health   Tobacco Use: Medium Risk (10/11/2024)   Patient History    Smoking Tobacco Use: Former    Smokeless Tobacco Use: Never    Passive Exposure: Not on file  Financial Resource Strain: Medium Risk (08/08/2024)   Overall Financial Resource Strain (CARDIA)    Difficulty of Paying Living Expenses: Somewhat hard  Food Insecurity: Food Insecurity Present (08/08/2024)  Epic    Worried About Programme Researcher, Broadcasting/film/video in the Last Year: Sometimes true    Barista in the Last Year: Never true  Transportation Needs: Unmet Transportation Needs (08/08/2024)   Epic    Lack of Transportation (Medical): Yes    Lack of Transportation (Non-Medical): Yes  Physical Activity: Insufficiently Active (08/08/2024)   Exercise Vital Sign    Days of Exercise per Week: 5 days    Minutes of Exercise per Session: 20 min  Stress: Stress Concern Present (08/08/2024)   [employer] of Occupational Health - Occupational Stress Questionnaire     Feeling of Stress: Rather much  Social Connections: Unknown (08/08/2024)   Social Connection and Isolation Panel    Frequency of Communication with Friends and Family: Once a week    Frequency of Social Gatherings with Friends and Family: Patient declined    Attends Religious Services: Patient declined    Database Administrator or Organizations: No    Attends Engineer, Structural: Not on file    Marital Status: Patient declined  Intimate Partner Violence: Not At Risk (09/14/2022)   Humiliation, Afraid, Rape, and Kick questionnaire    Fear of Current or Ex-Partner: No    Emotionally Abused: No    Physically Abused: No    Sexually Abused: No  Depression (PHQ2-9): Medium Risk (02/24/2024)   Depression (PHQ2-9)    PHQ-2 Score: 5  Alcohol Screen: Low Risk (08/08/2024)   Alcohol Screen    Last Alcohol Screening Score (AUDIT): 3  Housing: High Risk (08/08/2024)   Epic    Unable to Pay for Housing in the Last Year: Yes    Number of Times Moved in the Last Year: 3    Homeless in the Last Year: No  Utilities: Not At Risk (09/14/2022)   [employer] Utilities    Threatened with loss of utilities: No  Health Literacy: Not on file            [1] No Known Allergies    Rolinda Rogue, MD 10/12/24 [phone]  "

## 2024-10-13 ENCOUNTER — Telehealth (HOSPITAL_COMMUNITY): Payer: Self-pay

## 2024-10-13 MED ORDER — DOXYCYCLINE HYCLATE 100 MG PO TABS: 100.0000 mg | ORAL_TABLET | Freq: Two times a day (BID) | ORAL | 0 refills | Status: AC

## 2024-10-13 NOTE — Telephone Encounter (Signed)
 Pt called stating the pharmacy informed her medicaid would not cover prescription. Did not give reason. I was able to speak with a pharmacist with [facility], who stated this was likely due to the duration. Will send for 14 days, then send additional 14 days next week. I was unable to get a pharmacist/tech AT the specific [facility] on the phone. So I will a voicemail to cancel the 28 day script.  I notified pt of all information above. Advised to call back over the weekend to speak with on site RN if there is any further issue. Pt verbalized understanding.
